# Patient Record
Sex: Female | Born: 2000 | Race: White | Hispanic: No | Marital: Single | Smoking: Former smoker
Health system: Southern US, Community
[De-identification: ages and names within clinical notes are randomized; demographics above are authoritative.]

## PROBLEM LIST (undated history)

## (undated) DIAGNOSIS — F32A Depression, unspecified: Secondary | ICD-10-CM

## (undated) DIAGNOSIS — R7989 Other specified abnormal findings of blood chemistry: Secondary | ICD-10-CM

## (undated) DIAGNOSIS — N838 Other noninflammatory disorders of ovary, fallopian tube and broad ligament: Secondary | ICD-10-CM

## (undated) DIAGNOSIS — F321 Major depressive disorder, single episode, moderate: Secondary | ICD-10-CM

## (undated) DIAGNOSIS — Z8619 Personal history of other infectious and parasitic diseases: Secondary | ICD-10-CM

## (undated) HISTORY — DX: Major depressive disorder, single episode, moderate: F32.1

## (undated) HISTORY — DX: Depression, unspecified: F32.A

## (undated) HISTORY — DX: Personal history of other infectious and parasitic diseases: Z86.19

## (undated) HISTORY — DX: Other noninflammatory disorders of ovary, fallopian tube and broad ligament: N83.8

## (undated) HISTORY — DX: Other specified abnormal findings of blood chemistry: R79.89

---
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
## 2019-01-10 ENCOUNTER — Encounter: Payer: Self-pay | Admitting: Obstetrics and Gynecology

## 2019-01-10 ENCOUNTER — Ambulatory Visit (INDEPENDENT_AMBULATORY_CARE_PROVIDER_SITE_OTHER): Payer: BLUE CROSS/BLUE SHIELD | Admitting: Obstetrics and Gynecology

## 2019-01-10 ENCOUNTER — Other Ambulatory Visit: Payer: Self-pay

## 2019-01-10 VITALS — BP 110/60 | HR 81 | Ht 66.0 in | Wt 154.0 lb

## 2019-01-10 DIAGNOSIS — R300 Dysuria: Secondary | ICD-10-CM

## 2019-01-10 DIAGNOSIS — N760 Acute vaginitis: Secondary | ICD-10-CM

## 2019-01-10 DIAGNOSIS — Z1329 Encounter for screening for other suspected endocrine disorder: Secondary | ICD-10-CM

## 2019-01-10 DIAGNOSIS — Z113 Encounter for screening for infections with a predominantly sexual mode of transmission: Secondary | ICD-10-CM

## 2019-01-10 DIAGNOSIS — Z0000 Encounter for general adult medical examination without abnormal findings: Secondary | ICD-10-CM | POA: Diagnosis not present

## 2019-01-10 DIAGNOSIS — Z30013 Encounter for initial prescription of injectable contraceptive: Secondary | ICD-10-CM

## 2019-01-10 MED ORDER — MEDROXYPROGESTERONE ACETATE 150 MG/ML IM SUSP: 150.0000 mg | INTRAMUSCULAR | 3 refills | Status: DC

## 2019-01-10 NOTE — Progress Notes (Signed)
Gynecology Annual Exam  PCP: Steele Sizer, MD  Chief Complaint:  Chief Complaint  Patient presents with  . Gynecologic Exam    Hasnt had Depo sense December 2019, and no period     History of Present Illness: Patient is a 18 y.o. G0P0000 presents for annual exam. The patient has no complaints today.   LMP: Patient's last menstrual period was 08/23/2018 (within days).  Average Interval: irregular, no period since January, last Depo injection was in January. UPT today in office was negative.  Duration of flow: 0 days Heavy Menses: no Clots: no Intermenstrual Bleeding: no Postcoital Bleeding: yes- for 1 week Dysmenorrhea: yes. Moderate painful cramps with periods usually  The patient is sexually active. She currently uses none for contraception. She has dyspareunia.  The patient does not perform self breast exams.  There is no notable family history of breast or ovarian cancer in her family.  She reports recent pelvic pain and pain with intercourse. She has also notice pain with urination.  The patient wears seatbelts: yes.  The patient has regular exercise: not asked.    The patient denies current symptoms of depression.    Review of Systems: Review of Systems  Constitutional: Negative for chills, fever, malaise/fatigue and weight loss.  HENT: Negative for congestion, hearing loss and sinus pain.   Eyes: Negative for blurred vision and double vision.  Respiratory: Negative for cough, sputum production, shortness of breath and wheezing.   Cardiovascular: Negative for chest pain, palpitations, orthopnea and leg swelling.  Gastrointestinal: Negative for abdominal pain, constipation, diarrhea, nausea and vomiting.  Genitourinary: Positive for dysuria. Negative for flank pain, frequency, hematuria and urgency.  Musculoskeletal: Negative for back pain, falls and joint pain.  Skin: Negative for itching and rash.  Neurological: Negative for dizziness and headaches.   Psychiatric/Behavioral: Negative for depression, substance abuse and suicidal ideas. The patient is not nervous/anxious.     Past Medical History:  Past Medical History:  Diagnosis Date  . Elevated TSH   . History of chicken pox   . Mass of left ovary   . Moderate depressive episode [facility])     Past Surgical History:  Past Surgical History:  Procedure Laterality Date  . DENTAL SURGERY  2019   Dental implant    Gynecologic History:  Patient's last menstrual period was 08/23/2018 (within days). Contraception: none Last Pap: Inder 21  Obstetric History: G0P0000  Family History:  Family History  Problem Relation Age of Onset  . Obesity Mother     Social History:  Social History   Socioeconomic History  . Marital status: Single    Spouse name: Not on file  . Number of children: Not on file  . Years of education: Not on file  . Highest education level: Not on file  Occupational History  . Not on file  Social Needs  . Financial resource strain: Not on file  . Food insecurity:    Worry: Not on file    Inability: Not on file  . Transportation needs:    Medical: Not on file    Non-medical: Not on file  Tobacco Use  . Smoking status: Never Smoker  . Smokeless tobacco: Never Used  Substance and Sexual Activity  . Alcohol use: No    Alcohol/week: 0.0 standard drinks  . Drug use: Yes    Frequency: 7.0 times per week    Types: Marijuana    Comment: smoking   . Sexual activity: Yes  Partners: Male    Birth control/protection: None  Lifestyle  . Physical activity:    Days per week: 3 days    Minutes per session: 30 min  . Stress: Not at all  Relationships  . Social connections:    Talks on phone: Not on file    Gets together: Not on file    Attends religious service: Not on file    Active member of club or organization: Not on file    Attends meetings of clubs or organizations: Not on file    Relationship status: Not on file  . Intimate partner violence:     Fear of current or ex partner: Not on file    Emotionally abused: Not on file    Physically abused: Not on file    Forced sexual activity: Not on file  Other Topics Concern  . Not on file  Social History Narrative  . Not on file    Allergies:  No Known Allergies  Medications: Prior to Admission medications   Medication Sig Start Date End Date Taking? Authorizing Provider  lidocaine (XYLOCAINE) 2 % solution Use as directed 15 mLs in the mouth or throat daily before supper. And qhs Patient not taking: Reported on 01/10/2019 02/27/18   Steele Sizer, MD  medroxyPROGESTERone (DEPO-PROVERA) 150 MG/ML injection Inject 1 mL (150 mg total) into the muscle every 3 (three) months. Patient not taking: Reported on 01/10/2019 11/28/17   Steele Sizer, MD    Physical Exam Vitals: Blood pressure 110/60, pulse 81, height 5\' 6"  (1.676 m), weight 154 lb (69.9 kg), last menstrual period 08/23/2018.  General: NAD HEENT: normocephalic, anicteric Thyroid: no enlargement, no palpable nodules Pulmonary: No increased work of breathing, CTAB Cardiovascular: RRR, distal pulses 2+ Abdomen: NABS, soft, non-tender, non-distended.  Umbilicus without lesions.  No hepatomegaly, splenomegaly or masses palpable. No evidence of hernia  Genitourinary: offered, but patient declined. Opted to collect her own vaginal swab. Extremities: no edema, erythema, or tenderness Neurologic: Grossly intact Psychiatric: mood appropriate, affect full  Assessment: 18 y.o. G0P0000 routine annual exam  Plan: Problem List Items Addressed This Visit    None    Visit Diagnoses    Well adult exam    -  Primary   Relevant Orders   Basic Metabolic Panel (BMET)   TSH   CBC   Screening examination for STD (sexually transmitted disease)       Relevant Orders   HEP, RPR, HIV Panel   HSV(herpes smplx)abs-1+2(IgG+IgM)-bld   Acute vaginitis       Relevant Orders   NuSwab Vaginitis Plus (VG+)   Thyroid disorder screen        Relevant Orders   TSH   Encounter for initial prescription of injectable contraceptive       Relevant Medications   medroxyPROGESTERone (DEPO-PROVERA) 150 MG/ML injection   Dysuria       Relevant Orders   Urine Culture      1)  Gardasil Series discussed and if applicable offered to patient - Patient has previously completed 3 shot series. - Advised patient about vaccination with 9 valent. She will consider.   2) STI screening  was offered and accepted.   3)  ASCCP guidelines and rational discussed.  Patient opts for initiating screening at 21 screening interval  4) Contraception - the patient is currently using  none.  She is interested in starting Contraception: She would like to resumr Depo Provera We discussed safe sex practices to reduce her furture risk of  STI's.  Reviewed the quick start algorithm with the patient. Her last unprotected intercourse was 5 days ago. Discussed repeating her urine pregnancy test in 2-4 weeks to ensure that she is  not pregnant. Patient stated understanding. She will return tomorrow for depo provera administration.   5) Return in about 1 day (around 01/11/2019) for Depo injection visit in 1 day, annual in 1 year.  Adrian Prows MD [facility], [facility] 01/10/2019 9:46 PM

## 2019-01-11 ENCOUNTER — Ambulatory Visit: Payer: BLUE CROSS/BLUE SHIELD

## 2019-01-12 ENCOUNTER — Other Ambulatory Visit: Payer: Self-pay | Admitting: Obstetrics and Gynecology

## 2019-01-12 LAB — CBC
Hematocrit: 40.6 % (ref 34.0–46.6)
Hemoglobin: 13.9 g/dL (ref 11.1–15.9)
MCH: 30.7 pg (ref 26.6–33.0)
MCHC: 34.2 g/dL (ref 31.5–35.7)
MCV: 90 fL (ref 79–97)
Platelets: 218 10*3/uL (ref 150–450)
RBC: 4.53 x10E6/uL (ref 3.77–5.28)
RDW: 11.8 % (ref 11.7–15.4)
WBC: 7 10*3/uL (ref 3.4–10.8)

## 2019-01-12 LAB — HSV(HERPES SMPLX)ABS-I+II(IGG+IGM)-BLD
HSV 1 Glycoprotein G Ab, IgG: <0.91 index (ref 0.00–0.90)
HSV 2 IgG, Type Spec: <0.91 index (ref 0.00–0.90)
HSVI/II Comb IgM: <0.91 Ratio (ref 0.00–0.90)

## 2019-01-12 LAB — HEP, RPR, HIV PANEL
HIV Screen 4th Generation wRfx: NONREACTIVE
Hepatitis B Surface Ag: NEGATIVE
RPR Ser Ql: REACTIVE — AB

## 2019-01-12 LAB — BASIC METABOLIC PANEL
BUN/Creatinine Ratio: 14 (ref 9–23)
BUN: 11 mg/dL (ref 6–20)
CO2: 23 mmol/L (ref 20–29)
Calcium: 9.7 mg/dL (ref 8.7–10.2)
Chloride: 101 mmol/L (ref 96–106)
Creatinine, Ser: 0.81 mg/dL (ref 0.57–1.00)
GFR calc Af Amer: 123 mL/min/{1.73_m2} (ref >59)
GFR calc non Af Amer: 106 mL/min/{1.73_m2} (ref >59)
Glucose: 84 mg/dL (ref 65–99)
Potassium: 3.9 mmol/L (ref 3.5–5.2)
Sodium: 139 mmol/L (ref 134–144)

## 2019-01-12 LAB — TSH: TSH: 3.12 u[IU]/mL (ref 0.450–4.500)

## 2019-01-12 LAB — RPR, QUANT. (REFLEX): Rapid Plasma Reagin, Quant: 1:1 {titer} — ABNORMAL HIGH

## 2019-01-13 LAB — URINE CULTURE

## 2019-01-15 ENCOUNTER — Other Ambulatory Visit: Payer: Self-pay | Admitting: Obstetrics and Gynecology

## 2019-01-15 ENCOUNTER — Telehealth: Payer: Self-pay | Admitting: Obstetrics and Gynecology

## 2019-01-15 NOTE — Telephone Encounter (Signed)
[employer] from [facility] calling to see if TPPA test done.  Adv it hasn't been ordered.  Per Cory's request, please order as [facility] only keeps specimens for 7d.

## 2019-01-15 NOTE — Telephone Encounter (Signed)
I discussed this with Sandy on 01/12/2019. She told me it was ordered.

## 2019-01-16 ENCOUNTER — Other Ambulatory Visit: Payer: Self-pay | Admitting: Obstetrics and Gynecology

## 2019-01-16 LAB — SPECIMEN STATUS REPORT

## 2019-01-16 LAB — T PALLIDUM SCREENING CASCADE: T pallidum Antibodies (TP-PA): NONREACTIVE

## 2019-01-20 LAB — NUSWAB VAGINITIS PLUS (VG+)
Candida albicans, NAA: NEGATIVE
Candida glabrata, NAA: NEGATIVE
Chlamydia trachomatis, NAA: NEGATIVE
Neisseria gonorrhoeae, NAA: NEGATIVE
Trich vag by NAA: NEGATIVE

## 2019-01-23 ENCOUNTER — Telehealth: Payer: Self-pay

## 2019-01-23 NOTE — Telephone Encounter (Signed)
Teresa [name] from [place] [facility] calling to see if T Pallidum results were in.  Adv they were non-reactive.

## 2019-01-24 NOTE — Progress Notes (Signed)
Attempted to contact 01/23/2018, did not answer mobile, left message asking her to return the call. No answer at home number.

## 2019-01-25 ENCOUNTER — Telehealth: Payer: Self-pay

## 2019-01-25 ENCOUNTER — Other Ambulatory Visit: Payer: Self-pay | Admitting: Obstetrics and Gynecology

## 2019-01-25 DIAGNOSIS — Z113 Encounter for screening for infections with a predominantly sexual mode of transmission: Secondary | ICD-10-CM

## 2019-01-25 NOTE — Telephone Encounter (Signed)
Please advise. Thank you

## 2019-01-25 NOTE — Telephone Encounter (Signed)
Pt called after hour nurse at 5:07 on 6/17 stating she just got a vm from the Dr's office.  [phone]

## 2019-01-25 NOTE — Progress Notes (Signed)
Called and discussed with patient. She will repeat RPR screen in 4 weeks

## 2019-01-25 NOTE — Telephone Encounter (Signed)
Returned call

## 2019-01-25 NOTE — Telephone Encounter (Signed)
Patient is calling to speak with Dr. Gilman Schmidt. Please advise

## 2019-02-15 ENCOUNTER — Other Ambulatory Visit: Payer: BLUE CROSS/BLUE SHIELD

## 2019-02-16 ENCOUNTER — Other Ambulatory Visit: Payer: BLUE CROSS/BLUE SHIELD

## 2019-02-22 ENCOUNTER — Other Ambulatory Visit: Payer: BLUE CROSS/BLUE SHIELD

## 2019-05-15 ENCOUNTER — Other Ambulatory Visit: Payer: Self-pay

## 2019-05-15 ENCOUNTER — Ambulatory Visit
Admission: RE | Admit: 2019-05-15 | Discharge: 2019-05-15 | Disposition: A | Discharge status: Home / Self Care | Payer: Medicaid Other | Attending: Family Medicine | Admitting: Family Medicine

## 2019-05-15 ENCOUNTER — Ambulatory Visit (INDEPENDENT_AMBULATORY_CARE_PROVIDER_SITE_OTHER): Payer: Medicaid Other | Admitting: Family Medicine

## 2019-05-15 ENCOUNTER — Ambulatory Visit
Admission: RE | Admit: 2019-05-15 | Discharge: 2019-05-15 | Disposition: A | Discharge status: Home / Self Care | Payer: Medicaid Other | Source: Ambulatory Visit | Attending: Family Medicine | Admitting: Family Medicine

## 2019-05-15 ENCOUNTER — Encounter: Payer: Self-pay | Admitting: Family Medicine

## 2019-05-15 VITALS — BP 96/62 | HR 111 | Temp 97.8°F | Resp 14 | Ht 65.0 in | Wt 145.0 lb

## 2019-05-15 DIAGNOSIS — M25572 Pain in left ankle and joints of left foot: Secondary | ICD-10-CM | POA: Diagnosis not present

## 2019-05-15 DIAGNOSIS — S99912A Unspecified injury of left ankle, initial encounter: Secondary | ICD-10-CM | POA: Diagnosis not present

## 2019-05-15 NOTE — Patient Instructions (Addendum)
800 mg ibuprofen up to three times a day, and tylenol as directed for pain Also rest, Ice elevate, avoid weight bearing I'll call you after I see xrays and let you know what to do next (brace vs ortho etc)  . RICE Therapy for Routine Care of Injuries Many injuries can be cared for with rest, ice, compression, and elevation (RICE therapy). This includes:  Resting the injured part.  Putting ice on the injury.  Putting pressure (compression) on the injury.  Raising the injured part (elevation). Using RICE therapy can help to lessen pain and swelling. Supplies needed:  Ice.  Plastic bag.  Towel.  Elastic bandage.  Pillow or pillows to raise (elevate) your injured body part. How to care for your injury with RICE therapy Rest Limit your normal activities, and try not to use the injured part of your body. You can go back to your normal activities when your doctor says it is okay to do them and you feel okay. Ask your doctor if you should do exercises to help your injury get better. Ice Put ice on the injured area. Do not put ice on your bare skin.  Put ice in a plastic bag.  Place a towel between your skin and the bag.  Leave the ice on for 20 minutes, 2-3 times a day. Use ice on as many days as told by your doctor.  Compression Compression means putting pressure on the injured area. This can be done with an elastic bandage. If an elastic bandage has been put on your injury:  Do not wrap the bandage too tight. Wrap the bandage more loosely if part of your body away from the bandage is blue, swollen, cold, painful, or loses feeling (gets numb).  Take off the bandage and put it on again. Do this every 3-4 hours or as told by your doctor.  See your doctor if the bandage seems to make your problems worse.  Elevation Elevation means keeping the injured area raised. If you can, raise the injured area above your heart or the center of your chest. Contact a doctor if:  You keep  having pain and swelling.  Your symptoms get worse. Get help right away if:  You have sudden bad pain at your injury or lower than your injury.  You have redness or more swelling around your injury.  You have tingling or numbness at your injury or lower than your injury, and it does not go away when you take off the bandage. Summary  Many injuries can be cared for using rest, ice, compression, and elevation (RICE therapy).  You can go back to your normal activities when you feel okay and your doctor says it is okay.  Put ice on the injured area as told by your doctor.  Get help if your symptoms get worse or if you keep having pain and swelling. This information is not intended to replace advice given to you by your health care provider. Make sure you discuss any questions you have with your health care provider. Document Released: 01/12/2008 Document Revised: 04/15/2017 Document Reviewed: 04/15/2017 Elsevier Patient Education  2020 [employer].

## 2019-05-15 NOTE — Progress Notes (Signed)
Patient ID: Teresa [name], female    DOB: [birth year], 18 y.o.   MRN: [id number]  PCP: Steele Sizer, MD  Chief Complaint  Patient presents with  . Ankle Pain    swollen ankle, fell this morning coming down the stairs.    Subjective:   Teresa [name] is a 18 y.o. female, presents to clinic with CC of the following:  HPI  Patient complains of lateral left ankle pain and swelling that occurred this morning when she "felt on the stairs".  She tells me that she slipped on the bottom 2 stairs and inverted and rolled her ankle.  She previously told the MA student that she was drunk and fell down the stairs.  To me she denies any head injury, any other areas of pain and she denies any loss of consciousness.   She states that it took her a minute to get up and she was able to bear weight, she is limping slightly.  The lateral malleolus is swollen and tender to the touch there is no bruising.  She works all day on her feet at a [employer], she request a work note to allow her to rest her foot.  She denies any numbness, tingling. Pain has been moderate since injury, no meds tried, no other treatments, pain is exacerbated with walking or palpation and no other alleviating factors.  She is wearing slides and walked into office independently w/o assistance  Patient Active Problem List   Diagnosis Date Noted  . History of suicide attempt 06/01/2016  . MDD (major depressive disorder), recurrent episode, moderate ([place]) 05/31/2016  . History of ankle sprain 09/23/2015  . AD (atopic dermatitis) 05/12/2015  . Family planning 05/12/2015  . Elevated TSH 05/12/2015  . History of chlamydia infection 05/12/2015  . Screening for STD (sexually transmitted disease) 05/12/2015      Current Outpatient Medications:  .  lidocaine (XYLOCAINE) 2 % solution, Use as directed 15 mLs in the mouth or throat daily before supper. And qhs (Patient not taking: Reported on 01/10/2019), Disp: 200 mL, Rfl: 0 .   medroxyPROGESTERone (DEPO-PROVERA) 150 MG/ML injection, Inject 1 mL (150 mg total) into the muscle every 3 (three) months. (Patient not taking: Reported on 05/15/2019), Disp: 1 mL, Rfl: 3   No Known Allergies   Family History  Problem Relation Age of Onset  . Obesity Mother      Social History   Socioeconomic History  . Marital status: Single    Spouse name: Not on file  . Number of children: Not on file  . Years of education: Not on file  . Highest education level: Not on file  Occupational History  . Not on file  Social Needs  . Financial resource strain: Not on file  . Food insecurity    Worry: Not on file    Inability: Not on file  . Transportation needs    Medical: Not on file    Non-medical: Not on file  Tobacco Use  . Smoking status: Never Smoker  . Smokeless tobacco: Never Used  Substance and Sexual Activity  . Alcohol use: No    Alcohol/week: 0.0 standard drinks  . Drug use: Yes    Frequency: 7.0 times per week    Types: Marijuana    Comment: smoking   . Sexual activity: Yes    Partners: Male    Birth control/protection: None  Lifestyle  . Physical activity    Days per week: 3 days  Minutes per session: 30 min  . Stress: Not at all  Relationships  . Social Herbalist on phone: Not on file    Gets together: Not on file    Attends religious service: Not on file    Active member of club or organization: Not on file    Attends meetings of clubs or organizations: Not on file    Relationship status: Not on file  . Intimate partner violence    Fear of current or ex partner: Not on file    Emotionally abused: Not on file    Physically abused: Not on file    Forced sexual activity: Not on file  Other Topics Concern  . Not on file  Social History Narrative  . Not on file    Review of Systems  Constitutional: Negative.   HENT: Negative.   Eyes: Negative.   Respiratory: Negative.   Cardiovascular: Negative.   Gastrointestinal: Negative.    Endocrine: Negative.   Genitourinary: Negative.   Musculoskeletal: Negative.   Skin: Negative.   Allergic/Immunologic: Negative.   Neurological: Negative.   Hematological: Negative.   Psychiatric/Behavioral: Negative.   All other systems reviewed and are negative.      Objective:   Vitals:   05/15/19 1353  BP: 96/62  Pulse: (!) 111  Resp: 14  Temp: 97.8 F (36.6 C)  SpO2: 98%  Weight: 145 lb (65.8 kg)  Height: 5\' 5"  (1.651 m)    Body mass index is 24.13 kg/m.  Physical Exam Vitals signs and nursing note reviewed.  Constitutional:      Appearance: She is well-developed.  HENT:     Head: Normocephalic and atraumatic.     Nose: Nose normal.  Eyes:     General:        Right eye: No discharge.        Left eye: No discharge.     Conjunctiva/sclera: Conjunctivae normal.  Neck:     Trachea: No tracheal deviation.  Cardiovascular:     Rate and Rhythm: Normal rate and regular rhythm.  Pulmonary:     Effort: Pulmonary effort is normal. No respiratory distress.     Breath sounds: No stridor.  Musculoskeletal: Normal range of motion.     Left ankle: She exhibits swelling. She exhibits normal range of motion, no ecchymosis, no deformity, no laceration and normal pulse. Tenderness. Lateral malleolus tenderness found. No medial malleolus tenderness found. Achilles tendon normal.  Skin:    General: Skin is warm and dry.     Findings: No rash.  Neurological:     Mental Status: She is alert.     Motor: No abnormal muscle tone.     Coordination: Coordination normal.  Psychiatric:        Behavior: Behavior normal.            Assessment & Plan:      ICD-10-CM   1. Acute left ankle pain  M25.572 DG Ankle Complete Left    Swollen left ankle after injury early this morning.  Patient was able to walk on it independently immediately after the accident, gait is fairly stable here today.  There is some evident lateral malleolus edema and she is diffusely tender but she  has good range of motion sensation and strength.  Will get x-ray due to bony tenderness, fibular fracture versus ankle sprain.  Work note given to excuse her from today and tomorrow until I get x-ray imaging, advised rice therapy.  Explained to  the patient that until x-ray results will not know with a follow-up plan or return to work plan and she may need to return to be cleared or go to specialist to be cleared later.    Delsa Grana, PA-C 05/15/19 2:27 PM

## 2019-06-05 ENCOUNTER — Ambulatory Visit: Payer: Self-pay

## 2019-06-08 ENCOUNTER — Ambulatory Visit: Payer: Self-pay

## 2019-06-19 ENCOUNTER — Ambulatory Visit: Payer: Medicaid Other | Admitting: Family Medicine

## 2019-06-28 ENCOUNTER — Emergency Department
Admission: EM | Admit: 2019-06-28 | Discharge: 2019-06-28 | Disposition: A | Discharge status: Home / Self Care | Payer: Medicaid Other | Attending: Emergency Medicine | Admitting: Emergency Medicine

## 2019-06-28 ENCOUNTER — Encounter: Payer: Self-pay | Admitting: Emergency Medicine

## 2019-06-28 ENCOUNTER — Other Ambulatory Visit: Payer: Self-pay

## 2019-06-28 ENCOUNTER — Emergency Department: Payer: Medicaid Other

## 2019-06-28 DIAGNOSIS — N760 Acute vaginitis: Secondary | ICD-10-CM | POA: Insufficient documentation

## 2019-06-28 DIAGNOSIS — R102 Pelvic and perineal pain: Secondary | ICD-10-CM | POA: Diagnosis not present

## 2019-06-28 DIAGNOSIS — Z79899 Other long term (current) drug therapy: Secondary | ICD-10-CM | POA: Diagnosis not present

## 2019-06-28 DIAGNOSIS — N939 Abnormal uterine and vaginal bleeding, unspecified: Secondary | ICD-10-CM | POA: Diagnosis not present

## 2019-06-28 DIAGNOSIS — B9689 Other specified bacterial agents as the cause of diseases classified elsewhere: Secondary | ICD-10-CM | POA: Diagnosis not present

## 2019-06-28 DIAGNOSIS — R252 Cramp and spasm: Secondary | ICD-10-CM | POA: Diagnosis present

## 2019-06-28 LAB — URINALYSIS, COMPLETE (UACMP) WITH MICROSCOPIC
Bacteria, UA: NONE SEEN
Bilirubin Urine: NEGATIVE
Glucose, UA: NEGATIVE mg/dL
Hgb urine dipstick: NEGATIVE
Ketones, ur: NEGATIVE mg/dL
Leukocytes,Ua: NEGATIVE
Nitrite: NEGATIVE
Protein, ur: NEGATIVE mg/dL
Specific Gravity, Urine: 1.017 (ref 1.005–1.030)
pH: 6 (ref 5.0–8.0)

## 2019-06-28 LAB — WET PREP, GENITAL
Sperm: NONE SEEN
Trich, Wet Prep: NONE SEEN
Yeast Wet Prep HPF POC: NONE SEEN

## 2019-06-28 LAB — POCT PREGNANCY, URINE: Preg Test, Ur: NEGATIVE

## 2019-06-28 MED ORDER — FLUCONAZOLE 150 MG PO TABS: 150.0000 mg | ORAL_TABLET | Freq: Once | ORAL | 0 refills | Status: AC

## 2019-06-28 MED ORDER — MIDOL MAX ST MENSTRUAL 500-60-15 MG PO TABS: 1.0000 | ORAL_TABLET | ORAL | 0 refills | Status: DC | PRN

## 2019-06-28 MED ORDER — METRONIDAZOLE 500 MG PO TABS: 500.0000 mg | ORAL_TABLET | Freq: Two times a day (BID) | ORAL | 0 refills | Status: DC

## 2019-06-28 NOTE — ED Notes (Signed)
See triage note   Presents with abd cramping  States she stopped depo about 6 months ago  Had first period about 3 month ago  States her period has been irreg  But she has been having some bleeding for the past 2 weeks

## 2019-06-28 NOTE — ED Triage Notes (Signed)
Pt presents to ED via POV with c/o intermittent menstrual cramps and intermittent spotting x 2 weeks. Pt states recently changed BC, states sometimes mesntrual cycle is spotting bright red, sometimes is dark brown. Denies abnormally heavy bleeding at this time, states "I feel like for the cramps I'm having it should be a lot heavier than it is".

## 2019-06-28 NOTE — ED Provider Notes (Signed)
[facility] Emergency Department Provider Note  ____________________________________________  Time seen: Approximately 7:47 PM  I have reviewed the triage vital signs and the nursing notes.   HISTORY  Chief Complaint Menstrual Cramps    HPI Teresa [name] is a 18 y.o. female who presents the emergency department complaining of ongoing vaginal bleeding, right pelvic pain x2 weeks.  Patient reports she has had intermittent light vaginal bleeding x2 weeks with associated intermittent right pelvic pain.  Patient states that she was on Depo shots for over a year, then stopped earlier this year.  She has had irregular periods since then.  She reports it has been roughly 2 to 3 months since her last menstrual cycle.  Patient denies any fevers or chills, no dysuria, polyuria, hematuria.  Patient denies any vaginal discharge.  Patient states that she is concerned there may be something "all of my ovary" given her pain.  No history of cysts.  Patient states that she is actively trying to become pregnant.           Past Medical History:  Diagnosis Date  . Elevated TSH   . History of chicken pox   . Mass of left ovary   . Moderate depressive episode [facility])     Patient Active Problem List   Diagnosis Date Noted  . History of suicide attempt 06/01/2016  . MDD (major depressive disorder), recurrent episode, moderate ([place]) 05/31/2016  . History of ankle sprain 09/23/2015  . AD (atopic dermatitis) 05/12/2015  . Family planning 05/12/2015  . Elevated TSH 05/12/2015  . History of chlamydia infection 05/12/2015  . Screening for STD (sexually transmitted disease) 05/12/2015    Past Surgical History:  Procedure Laterality Date  . DENTAL SURGERY  2019   Dental implant    Prior to Admission medications   Medication Sig Start Date End Date Taking? Authorizing Provider  Acetaminophen-Caff-Pyrilamine (MIDOL MAX ST MENSTRUAL) 500-60-15 MG TABS Take 1 tablet by mouth every  4 (four) hours as needed. 06/28/19   Swara Donze, Charline Bills, PA-C  fluconazole (DIFLUCAN) 150 MG tablet Take 1 tablet (150 mg total) by mouth once for 1 dose. Take after finishing antibiotic 06/28/19 06/28/19  Sussie Minor, Charline Bills, PA-C  metroNIDAZOLE (FLAGYL) 500 MG tablet Take 1 tablet (500 mg total) by mouth 2 (two) times daily. 06/28/19   Ramey Schiff, Charline Bills, PA-C    Allergies Patient has no known allergies.  Family History  Problem Relation Age of Onset  . Obesity Mother     Social History Social History   Tobacco Use  . Smoking status: Never Smoker  . Smokeless tobacco: Never Used  Substance Use Topics  . Alcohol use: No    Alcohol/week: 0.0 standard drinks  . Drug use: Yes    Frequency: 7.0 times per week    Types: Marijuana    Comment: smoking      Review of Systems  Constitutional: No fever/chills Eyes: No visual changes. No discharge ENT: No upper respiratory complaints. Cardiovascular: no chest pain. Respiratory: no cough. No SOB. Gastrointestinal: No abdominal pain.  No nausea, no vomiting.  No diarrhea.  No constipation. Genitourinary: Negative for dysuria. No hematuria. Vaginal bleeding, No vaginal discharge Musculoskeletal: Negative for musculoskeletal pain. Skin: Negative for rash, abrasions, lacerations, ecchymosis. Neurological: Negative for headaches, focal weakness or numbness. 10-point ROS otherwise negative.  ____________________________________________   PHYSICAL EXAM:  VITAL SIGNS: ED Triage Vitals  Enc Vitals Group     BP 06/28/19 1753 111/61  Pulse Rate 06/28/19 1753 97     Resp 06/28/19 1753 18     Temp 06/28/19 1753 98.4 F (36.9 C)     Temp Source 06/28/19 1753 Oral     SpO2 06/28/19 1753 100 %     Weight 06/28/19 1735 120 lb (54.4 kg)     Height 06/28/19 1735 5\' 6"  (1.676 m)     Head Circumference --      Peak Flow --      Pain Score 06/28/19 1735 5     Pain Loc --      Pain Edu? --      Excl. in [place]? --       Constitutional: Alert and oriented. Well appearing and in no acute distress. Eyes: Conjunctivae are normal. PERRL. EOMI. Head: Atraumatic. ENT:      Ears:       Nose: No congestion/rhinnorhea.      Mouth/Throat: Mucous membranes are moist.  Neck: No stridor.    Cardiovascular: Normal rate, regular rhythm. Normal S1 and S2.  Good peripheral circulation. Respiratory: Normal respiratory effort without tachypnea or retractions. Lungs CTAB. Good air entry to the bases with no decreased or absent breath sounds. Gastrointestinal: Bowel sounds 4 quadrants. Soft to palpation all quadrants.  Patient is mildly tender to palpation in the right lower pelvic region.  No frank right lower quadrant pain/tenderness to palpation.. No guarding or rigidity. No palpable masses. No distention. No CVA tenderness. Genitourinary: No external lesions or chancres noted.  Exam of speculum reveals no vaginal lesions or tears.  Cervix is identified.  Patient is currently menstruating and the blood is noted in the vaginal vault.  No other appreciable discharge identified.  No cervical motion tenderness.  No palpable lesions or masses in the bilateral adnexa. Musculoskeletal: Full range of motion to all extremities. No gross deformities appreciated. Neurologic:  Normal speech and language. No gross focal neurologic deficits are appreciated.  Skin:  Skin is warm, dry and intact. No rash noted. Psychiatric: Mood and affect are normal. Speech and behavior are normal. Patient exhibits appropriate insight and judgement.   ____________________________________________   LABS (all labs ordered are listed, but only abnormal results are displayed)  Labs Reviewed  WET PREP, GENITAL - Abnormal; Notable for the following components:      Result Value   Clue Cells Wet Prep HPF POC PRESENT (*)    WBC, Wet Prep HPF POC FEW (*)    All other components within normal limits  URINALYSIS, COMPLETE (UACMP) WITH MICROSCOPIC -  Abnormal; Notable for the following components:   Color, Urine YELLOW (*)    APPearance CLEAR (*)    All other components within normal limits  GC/CHLAMYDIA PROBE AMP  POC URINE PREG, ED  POCT PREGNANCY, URINE   ____________________________________________  EKG   ____________________________________________  RADIOLOGY I personally viewed and evaluated these images as part of my medical decision making, as well as reviewing the written report by the radiologist.  US Pelvic Complete W Transvaginal And Torsion R/o  Result Date: 06/28/2019 CLINICAL DATA:  Pelvic pain, vaginal bleeding EXAM: TRANSABDOMINAL AND TRANSVAGINAL ULTRASOUND OF PELVIS DOPPLER ULTRASOUND OF OVARIES TECHNIQUE: Both transabdominal and transvaginal ultrasound examinations of the pelvis were performed. Transabdominal technique was performed for global imaging of the pelvis including uterus, ovaries, adnexal regions, and pelvic cul-de-sac. It was necessary to proceed with endovaginal exam following the transabdominal exam to visualize the uterus, endometrium, ovaries and adnexa. Color and duplex Doppler ultrasound was utilized to evaluate  blood flow to the ovaries. COMPARISON:  09/26/2014 FINDINGS: Uterus Measurements: 7.9 x 3.4 x 4.8 cm = volume: 65 mL. No fibroids or other mass visualized. Endometrium Thickness: 5 mm in thickness.  No focal abnormality visualized. Right ovary Measurements: 3.3 x 1.3 x 1.5 cm = volume: 3.0 mL. Normal appearance/no adnexal mass. Left ovary Measurements: 3.2 x 1.7 x 2.1 cm = volume: 6.0 mL. Normal appearance/no adnexal mass. Pulsed Doppler evaluation of both ovaries demonstrates normal low-resistance arterial and venous waveforms. Other findings No abnormal free fluid. IMPRESSION: Normal study. Electronically Signed   By: Rolm Baptise M.D.   On: 06/28/2019 19:34    ____________________________________________    PROCEDURES  Procedure(s) performed:    Procedures    Medications - No  data to display   ____________________________________________   INITIAL IMPRESSION / ASSESSMENT AND PLAN / ED COURSE  Pertinent labs & imaging results that were available during my care of the patient were reviewed by me and considered in my medical decision making (see chart for details).  Review of the [place] CSRS was performed in accordance of the [facility] prior to dispensing any controlled drugs.           Patient's diagnosis is consistent with ongoing vaginal bleeding, pelvic pain, BV.  Patient presented to emergency department with a complaint of intermittent right pelvic pain, ongoing vaginal bleeding x2 weeks.  Patient states that she has been on Depo until recently, has not had a return to her normal menstrual cycle after stopping this form of birth control.  She is not on birth control currently as she states that she is trying to become pregnant.  Patient has had what she describes as a 2-week long menstrual cycle.  She states that the cramping is not worse than initial psychologist a lot longer.  Patient denied any other vaginal discharge, dysuria, abdominal pain, nausea vomiting, diarrhea or constipation.  Patient did not try any medications at home.  After initial evaluation for pelvic pain and ongoing vaginal bleeding, patient expressed a concern for STD.  Patient does not endorse any specific known contact with an STD but states that she would like to be checked.  This is a send off test and will not return at this time.  Labs returned positive with clue cells.  Patient will be started on Flagyl for BV.  I will prescribe fluconazole to be taken after finishing antibiotic course if patient begins to develop symptoms of candidal infection.  Patient is also given Midol for ongoing pelvic cramps from initial cycle.  Follow-up with primary care or OB/GYN as needed.  Patient is given ED precautions to return to the ED for any worsening or new  symptoms.     ____________________________________________  FINAL CLINICAL IMPRESSION(S) / ED DIAGNOSES  Final diagnoses:  Vaginal bleeding  Pelvic pain  BV (bacterial vaginosis)      NEW MEDICATIONS STARTED DURING THIS VISIT:  ED Discharge Orders         Ordered    metroNIDAZOLE (FLAGYL) 500 MG tablet  2 times daily     06/28/19 2048    fluconazole (DIFLUCAN) 150 MG tablet   Once     06/28/19 2048    Acetaminophen-Caff-Pyrilamine (MIDOL MAX ST MENSTRUAL) 500-60-15 MG TABS  Every 4 hours PRN     06/28/19 2048              This chart was dictated using voice recognition software/Dragon. Despite best efforts to proofread, errors can occur which can  change the meaning. Any change was purely unintentional.    Darletta Moll, PA-C 06/28/19 2049    Arta Silence, MD 06/28/19 2214

## 2019-07-20 ENCOUNTER — Other Ambulatory Visit: Payer: Self-pay

## 2019-07-20 DIAGNOSIS — Z20822 Contact with and (suspected) exposure to covid-19: Secondary | ICD-10-CM

## 2019-07-21 LAB — NOVEL CORONAVIRUS, NAA: SARS-CoV-2, NAA: NOT DETECTED

## 2019-09-05 ENCOUNTER — Ambulatory Visit: Payer: Medicaid Other | Attending: Internal Medicine

## 2019-09-05 DIAGNOSIS — Z20822 Contact with and (suspected) exposure to covid-19: Secondary | ICD-10-CM

## 2019-09-06 LAB — NOVEL CORONAVIRUS, NAA: SARS-CoV-2, NAA: NOT DETECTED

## 2019-09-10 ENCOUNTER — Ambulatory Visit: Payer: Medicaid Other | Admitting: Physician Assistant

## 2019-09-10 ENCOUNTER — Encounter: Payer: Self-pay | Admitting: Physician Assistant

## 2019-09-10 ENCOUNTER — Other Ambulatory Visit: Payer: Self-pay

## 2019-09-10 DIAGNOSIS — Z113 Encounter for screening for infections with a predominantly sexual mode of transmission: Secondary | ICD-10-CM | POA: Diagnosis not present

## 2019-09-10 DIAGNOSIS — Z0389 Encounter for observation for other suspected diseases and conditions ruled out: Secondary | ICD-10-CM | POA: Diagnosis not present

## 2019-09-10 DIAGNOSIS — Z3161 Procreative counseling and advice using natural family planning: Secondary | ICD-10-CM

## 2019-09-10 DIAGNOSIS — Z3009 Encounter for other general counseling and advice on contraception: Secondary | ICD-10-CM | POA: Diagnosis not present

## 2019-09-10 DIAGNOSIS — Z1388 Encounter for screening for disorder due to exposure to contaminants: Secondary | ICD-10-CM | POA: Diagnosis not present

## 2019-09-10 LAB — WET PREP FOR TRICH, YEAST, CLUE
Trichomonas Exam: NEGATIVE
Yeast Exam: NEGATIVE

## 2019-09-10 MED ORDER — MULTIVITAMINS PO CAPS: 1.0000 | ORAL_CAPSULE | Freq: Every day | ORAL | 0 refills | Status: DC

## 2019-09-10 NOTE — Progress Notes (Signed)
Wet mount reviewed by provider, no tx per Antoine Primas, PA.

## 2019-09-10 NOTE — Progress Notes (Signed)
[facility] Department STI clinic/screening visit  Subjective:  Teresa [name] is a 19 y.o. female being seen today for an STI screening visit. The patient reports they do not have symptoms.  Patient reports that they are thinking about becoming pregnant in the next year.   They reported they are not interested in discussing contraception today.  No LMP recorded. (Menstrual status: Irregular Periods).   Patient has the following medical conditions:   Patient Active Problem List   Diagnosis Date Noted  . History of suicide attempt 06/01/2016  . MDD (major depressive disorder), recurrent episode, moderate ([place]) 05/31/2016  . History of ankle sprain 09/23/2015  . AD (atopic dermatitis) 05/12/2015  . Family planning 05/12/2015  . Elevated TSH 05/12/2015  . History of chlamydia infection 05/12/2015  . Screening for STD (sexually transmitted disease) 05/12/2015    Chief Complaint  Patient presents with  . SEXUALLY TRANSMITTED DISEASE    STD screening including bloodwork    HPI  Patient reports that she is having some cramp-like sharp pains off and in but is not sure if it is related to her period or sexual activity.  Would like a screening to make sure no infection present.  Interested in info about [employer].  Denies other concerns today.  See flowsheet for further details and programmatic requirements.    The following portions of the patient's history were reviewed and updated as appropriate: allergies, current medications, past medical history, past social history, past surgical history and problem list.  Objective:  There were no vitals filed for this visit.  Physical Exam Constitutional:      General: She is not in acute distress.    Appearance: Normal appearance. She is normal weight.  HENT:     Head: Normocephalic and atraumatic.     Comments: No nits, lice or hair loss. No cervical, supraclavicular or axillary adenopathy.    Mouth/Throat:    Mouth: Mucous membranes are moist.     Pharynx: Oropharynx is clear. No oropharyngeal exudate.  Eyes:     Conjunctiva/sclera: Conjunctivae normal.  Pulmonary:     Effort: Pulmonary effort is normal.  Abdominal:     Palpations: Abdomen is soft. There is no mass.     Tenderness: There is no abdominal tenderness. There is no guarding or rebound.  Genitourinary:    General: Normal vulva.     Rectum: Normal.     Comments: External genitalia/pubic area without nits, lice, edema, erythema, lesions and inguinal adenopathy. Vagina with normal mucosa and discharge. Cervix without visible lesions. Uterus firm, mobile, nt, no masses, no CMT, no adnexal tenderness or fullness. Musculoskeletal:     Cervical back: Neck supple. No tenderness.  Skin:    General: Skin is warm and dry.     Findings: No bruising, erythema, lesion or rash.  Neurological:     Mental Status: She is alert and oriented to person, place, and time.  Psychiatric:        Mood and Affect: Mood normal.        Behavior: Behavior normal.        Thought Content: Thought content normal.        Judgment: Judgment normal.      Assessment and Plan:  Teresa [name] is a 19 y.o. female presenting to the [facility] Department for STI screening  1. Screening for STD (sexually transmitted disease) Patient into clinic with cramping.  Denies other symptoms. Rec condoms with all sex. Await test  results.  Counseled that RN will call if needs to RTC for treatment once results are back. - WET PREP FOR TRICH, YEAST, CLUE - Gonococcus culture - Chlamydia/Gonorrhea [place] [facility] - HIV [place] [facility] - Syphilis Serology, [place] [facility] - Gonococcus culture  2. Procreative counseling and advice using natural family planning Reviewed with patient Natural Family Planning info and written info also given to patient to review and chart cycles. - Multiple Vitamin (MULTIVITAMIN) capsule; Take 1 capsule by mouth daily.  Dispense: 100  capsule; Refill: 0     No follow-ups on file.  No future appointments.  Jerene Dilling, PA

## 2019-09-15 LAB — GONOCOCCUS CULTURE

## 2019-10-02 DIAGNOSIS — H5213 Myopia, bilateral: Secondary | ICD-10-CM | POA: Diagnosis not present

## 2019-10-03 DIAGNOSIS — H5213 Myopia, bilateral: Secondary | ICD-10-CM | POA: Diagnosis not present

## 2019-10-29 DIAGNOSIS — H5213 Myopia, bilateral: Secondary | ICD-10-CM | POA: Diagnosis not present

## 2019-12-11 ENCOUNTER — Ambulatory Visit: Payer: Self-pay

## 2019-12-11 DIAGNOSIS — U071 COVID-19: Secondary | ICD-10-CM | POA: Diagnosis not present

## 2019-12-11 NOTE — Telephone Encounter (Signed)
Patient called stating that she has had a rapid COVID-19 test that was negative today.  She is feeling so bad that she is tearful.  She states that she has sore throat cough stuffy nose and chest feels tight at times.  She states that she sometimes has a wheeze but she can control it with relaxing.  She has no fever.  She was tested for COVID-19 because her mother is positive.  She states they live in same home but Mom has been quarantining. Care advice read to patient.  She verbalized understanding. No appointment available today at office. Patient will go to [facility] for evaluation of symptoms. She was concerned about retesting. She was told to wait 5 days and if still having symptoms that she can retest. She verbalized understanding of all information.  Reason for Disposition . Wheezing is present  Answer Assessment - Initial Assessment Questions 1. ONSET: "When did the cough begin?"      2 days ago 2. SEVERITY: "How bad is the cough today?"     severe 3. RESPIRATORY DISTRESS: "Describe your breathing."      Some wheezing 4. FEVER: "Do you have a fever?" If so, ask: "What is your temperature, how was it measured, and when did it start?"     none 5. HEMOPTYSIS: "Are you coughing up any blood?" If so ask: "How much?" (flecks, streaks, tablespoons, etc.)     Non productive 6. TREATMENT: "What have you done so far to treat the cough?" (e.g., meds, fluids, humidifier)     Cough drops helps sore throat 7. CARDIAC HISTORY: "Do you have any history of heart disease?" (e.g., heart attack, congestive heart failure)      none 8. LUNG HISTORY: "Do you have any history of lung disease?"  (e.g., pulmonary embolus, asthma, emphysema)     none 9. PE RISK FACTORS: "Do you have a history of blood clots?" (or: recent major surgery, recent prolonged travel, bedridden)     no 10. OTHER SYMPTOMS: "Do you have any other symptoms? (e.g., runny nose, wheezing, chest pain)       Stuffy nose , chest tightness, sore  throat 11. PREGNANCY: "Is there any chance you are pregnant?" "When was your last menstrual period?"       No  Period 4/19 12. TRAVEL: "Have you traveled out of the country in the last month?" (e.g., travel history, exposures)       No exposed to mother who has covid 19 +  Protocols used: COUGH - ACUTE NON-PRODUCTIVE-A-AH

## 2019-12-12 ENCOUNTER — Telehealth: Payer: Self-pay

## 2019-12-12 NOTE — Telephone Encounter (Signed)
Copied from [place] [id number]. Topic: General - Other >> Dec 12, 2019 12:44 PM Mcneil, Ja-Kwan wrote: Reason for CRM: Pt returned call and stated she does not need an appt with Dr. Ancil Boozer.

## 2019-12-12 NOTE — Telephone Encounter (Signed)
Called patient. No answer LVM to see if she went to UC or if she needs an appointment on Thursday.

## 2019-12-20 ENCOUNTER — Ambulatory Visit: Payer: Medicaid Other

## 2019-12-21 ENCOUNTER — Ambulatory Visit: Payer: Medicaid Other

## 2019-12-24 ENCOUNTER — Ambulatory Visit: Payer: Medicaid Other

## 2019-12-24 DIAGNOSIS — R070 Pain in throat: Secondary | ICD-10-CM | POA: Diagnosis not present

## 2019-12-24 DIAGNOSIS — J209 Acute bronchitis, unspecified: Secondary | ICD-10-CM | POA: Diagnosis not present

## 2020-02-11 NOTE — Progress Notes (Signed)
Patient ID: Teresa [name], female    DOB: 2001/01/07, 19 y.o.   MRN: [id number]  PCP: Steele Sizer, MD  Chief Complaint  Patient presents with  . Mood swings    patient took medication to help with her mood swings a year or more ago, patient had stopped medication and since starting a new job the mood swings have come back  . Bacterial Vaginosis    patient feels that before her mestrual cycle or after she expirences symtpoms of BV, she does not currently have a Obgyn    Subjective:   Teresa [name] is a 19 y.o. female, presents to clinic with CC of the following:  Chief Complaint  Patient presents with  . Mood swings    patient took medication to help with her mood swings a year or more ago, patient had stopped medication and since starting a new job the mood swings have come back  . Bacterial Vaginosis    patient feels that before her mestrual cycle or after she expirences symtpoms of BV, she does not currently have a Obgyn    HPI:  Patient is a 19 y.o. female patient of Dr. Ancil Boozer She was Covid + on testing Dec 11, 2019.   She was seen at the [facility] in Feb 2021 for the following:  Teresa [name] is a 19 y.o. female being seen today for an STI screening visit. The patient reports they do not have symptoms.  Patient reports that they are thinking about  becoming pregnant in the next year.   They reported they are not interested in discussing contraception today.  No LMP recorded. (Menstrual status: Irregular Periods).  She was seen in the ER in Nov 2020, her visit summarized as follows:  Patient's diagnosis is consistent with ongoing vaginal bleeding, pelvic pain, BV.  Patient presented to emergency department with a complaint of intermittent right pelvic pain, ongoing vaginal bleeding x2 weeks.  Patient states that she has been on Depo until recently, has not had a return to her normal menstrual cycle after stopping this form of birth control.  She is  not on birth control currently as she states that she is trying to become pregnant.  Patient has had what she describes as a 2-week long menstrual cycle.  She states that the cramping is not worse than initial psychologist a lot longer.  Patient denied any other vaginal discharge, dysuria, abdominal pain, nausea vomiting, diarrhea or constipation.  Patient did not try any medications at home.  After initial evaluation for pelvic pain and ongoing vaginal bleeding, patient expressed a concern for STD.  Patient does not endorse any specific known contact with an STD but states that she would like to be checked.  This is a send off test and will not return at this time.  Labs returned positive with clue cells.  Patient will be started on Flagyl for BV.  I will prescribe fluconazole to be taken after finishing antibiotic course if patient begins to develop symptoms of candidal infection.  Patient is also given Midol for ongoing pelvic cramps from initial cycle.  Follow-up with primary care or OB/GYN as needed.  Patient is given ED precautions to return to the ED for any worsening or new symptoms.  Last seen at [facility] in October 2020 by Delsa Grana for ankle pain She had a well woman exam with ob/gyn last in early June 2020 Her last visit with Dr. Ancil Boozer was  in July of 2019.  She returns today with mood swing concerns, also BV concerns related to her menstrual cycle.  She notes that she works as a Freight forwarder at [employer], and she had her store changed recently with increased anxiety as a result.  States she had some issues with anxiety and depressive symptoms a few years back, and was on a medicine, but did not like how she felt on that medicine.  She thinks that was Zoloft.  She was doing well off of the medicine until this recent change with work.  She states she is feeling more anxious than depressed, and having more mood swings and being majorly depressed.  She does sometimes have panic episodes where she shakes,  and had to leave work last week 1 time as a result.  Denies any chest pains, shortness of breath, or any history of heart disease.  She noted she did have an abnormality in her thyroid many years ago, in her early teens, although checks after that were good.  She denies any anemia history. TSH and CBC checks from the last 2 to 3 years have been normal.  The last check was in June 2020. She has never had counseling for anxiety or depression. Her PHQ-9 and GAD-7's were reviewed.  She denies any thoughts of hurting self or hurting others. She also noted that intermittently, she has some symptoms of some itch and some burning with urination, sometimes before cycles, sometimes after, and notes the symptoms are very mild.  She is only sexually active with her boyfriend recently, does not have concerns that she has an STD.  She requested taking the medicine she had previously for BV to help eradicate these symptoms.  She has more recently been seen at the health department for GYN concerns, and has not followed up with OB/GYN in her recent past.  She denies any vaginal discharge, abnormal bleeding more recently, and notes when the symptoms occur they are very mild.  She at times will have some cramping in her right suprapubic region, although that is not new.  Patient Active Problem List   Diagnosis Date Noted  . History of suicide attempt 06/01/2016  . MDD (major depressive disorder), recurrent episode, moderate ([place]) 05/31/2016  . History of ankle sprain 09/23/2015  . AD (atopic dermatitis) 05/12/2015  . Family planning 05/12/2015  . Elevated TSH 05/12/2015  . History of chlamydia infection 05/12/2015  . Screening for STD (sexually transmitted disease) 05/12/2015      Current Outpatient Medications:  .  Acetaminophen-Caff-Pyrilamine (MIDOL MAX ST MENSTRUAL) 500-60-15 MG TABS, Take 1 tablet by mouth every 4 (four) hours as needed., Disp: 30 tablet, Rfl: 0 .  metroNIDAZOLE (FLAGYL) 500 MG tablet, Take  1 tablet (500 mg total) by mouth 2 (two) times daily., Disp: 14 tablet, Rfl: 0 .  Multiple Vitamin (MULTIVITAMIN) capsule, Take 1 capsule by mouth daily., Disp: 100 capsule, Rfl: 0   No Known Allergies   Past Surgical History:  Procedure Laterality Date  . DENTAL SURGERY  2019   Dental implant     Family History  Problem Relation Age of Onset  . Obesity Mother      Social History   Tobacco Use  . Smoking status: Never Smoker  . Smokeless tobacco: Never Used  Substance Use Topics  . Alcohol use: No    Alcohol/week: 0.0 standard drinks    With staff assistance, above reviewed with the patient today.  ROS: As per HPI, otherwise no specific  complaints on a limited and focused system review   No results found for this or any previous visit (from the past 72 hour(s)).   PHQ2/9: Depression screen [facility] 2/9 02/12/2020 05/15/2019 11/28/2017 09/29/2016 09/23/2015  Decreased Interest 1 0 1 0 0  Down, Depressed, Hopeless 1 0 0 0 0  PHQ - 2 Score 2 0 1 0 0  Altered sleeping 2 0 1 0 -  Tired, decreased energy 2 0 0 0 -  Change in appetite 1 0 2 0 -  Feeling bad or failure about yourself  0 0 0 0 -  Trouble concentrating 2 0 0 0 -  Moving slowly or fidgety/restless 2 0 0 0 -  Suicidal thoughts 0 0 0 0 -  PHQ-9 Score 11 0 4 0 -  Difficult doing work/chores Somewhat difficult Not difficult at all Not difficult at all - -   PHQ-2/9 Result is reviewed  Fall Risk: Fall Risk  02/12/2020 05/15/2019 09/23/2015 05/12/2015  Falls in the past year? 0 1 No No  Number falls in past yr: 0 0 - -  Injury with Fall? 0 1 - -      GAD 7 : Generalized Anxiety Score 02/12/2020  Nervous, Anxious, on Edge 1  Control/stop worrying 1  Worry too much - different things 2  Trouble relaxing 2  Restless 0  Easily annoyed or irritable 1  Afraid - awful might happen 2  Total GAD 7 Score 9  Anxiety Difficulty Somewhat difficult   GAD7 reviewed   Objective:   Vitals:   02/12/20 1127  BP: 102/64    Pulse: 87  Resp: 16  Temp: 98.5 F (36.9 C)  TempSrc: Temporal  SpO2: 99%  Weight: 140 lb 9.6 oz (63.8 kg)  Height: 5\' 5"  (1.651 m)    Body mass index is 23.4 kg/m.  Physical Exam   NAD, masked, pleasant HEENT - [place]/AT, sclera anicteric, conj - non-inj'ed, pharynx clear Neck - supple, no adenopathy, no TM,  Car - RRR without m/g/r Pulm- RR and effort normal at rest, CTA without wheeze or rales Abd - soft, NT diffusely, nontender in the right suprapubic region to palpation where she occasionally has some crampiness noted Back - no CVA tenderness Ext - no LE edema,  Neuro/psychiatric - affect was not flat, appropriate with conversation  Alert   Grossly non-focal   Speech  Normal, not rapid  Results for orders placed or performed in visit on 09/10/19  WET PREP FOR TRICH, YEAST, CLUE  Result Value Ref Range   Trichomonas Exam Negative Negative   Yeast Exam Negative Negative   Clue Cell Exam Comment: Negative  Gonococcus culture   Specimen: Pharynx; Throat   PH  Result Value Ref Range   GC Culture Only Final report    Result 1 Comment   Gonococcus culture   Specimen: Rectum; Genital   RT  Result Value Ref Range   GC Culture Only Final report    Result 1 Comment    Urine dip -trace protein, negative otherwise    Assessment & Plan:   1. Generalized anxiety disorder with panic episodes  Educated on why daily medication felt best (help lower generalized anxiety level and lessen height of peaks to better control symptoms) Medications reviewed including risk/benefits and potential side effects, mutually agreed to start a Lexapro product-will take half of a 10 mg tablet for 10 to 14 days, and can increase to 1 tablet after if not significantly improved with the  half a tablet daily.  Did warn of potential GI side effects that can occur, and often do go away over time in the first week or 2. Also recommended counseling, with [facility] and [facility] and others in the area noted to her  as options.  Also have a list here can provide to help and recommended she contact her insurance company to help assess which ones may be covered to help with the cost.  Did note to her that the 2 together, counseling and medicines, work better than either alone.  - escitalopram (LEXAPRO) 10 MG tablet; Take 1 tablet (10 mg total) by mouth daily. Take 1/2 tablet daily to start for 10-14 days, can increase to one tablet daily after as directed  Dispense: 30 tablet; Refill: 1  2. Depression with anxiety Her PHQ-9 was positive for some mild depressive symptoms, and she does have a history of major depression in her past. Lexapro also can help with depressive symptoms.  Also counseling as noted above can be helpful and recommended No concerns for HI/SI today.  - escitalopram (LEXAPRO) 10 MG tablet; Take 1 tablet (10 mg total) by mouth daily. Take 1/2 tablet daily to start for 10-14 days, can increase to one tablet daily after as directed  Dispense: 30 tablet; Refill: 1  3. Mood changes As above, the medication and counseling can be helpful - escitalopram (LEXAPRO) 10 MG tablet; Take 1 tablet (10 mg total) by mouth daily. Take 1/2 tablet daily to start for 10-14 days, can increase to one tablet daily after as directed  Dispense: 30 tablet; Refill: 1  4. Dysuria She did note some mild burning with urination intermittently, with her urine dip today unremarkable for any infectious concerns.  She only had trace protein noted. Do not feel sending for a formal urinalysis or culture is needed. - POCT Urinalysis Dipstick  5. BV (bacterial vaginosis) She has a history of this as noted in the HPI, and I did echo concerns with just managing this without further evaluation.  She was convinced that the metronidazole would be helpful and wanted to take, and did not feel unreasonable to do so presently given her history. Emphasized if her symptoms are not completely resolved and stay completely resolved after the  metronidazole course, she needs follow-up again and a referral to gynecology will be recommended at that time, as I did note today the importance if she is having recurrent infectious symptoms, even if low-grade, the importance of being evaluated and managed appropriately and she was understanding of this.  - metroNIDAZOLE (FLAGYL) 500 MG tablet; Take 1 tablet (500 mg total) by mouth 2 (two) times daily.  Dispense: 14 tablet; Refill: 0   Follow-up was recommended in 4 weeks time, sooner as needed.  Discussed potentially repeating some labs today, although she did note she just had some test done at the health department and was not anxious in doing so today, and I did review her last labs in June 2020 with a normal basic metabolic panel, TSH, and CBC noted at that time. May more strongly recommend repeating these at her follow-up visit pending her clinical status.    Towanda Malkin, MD 02/12/20 11:52 AM

## 2020-02-12 ENCOUNTER — Encounter: Payer: Self-pay | Admitting: Internal Medicine

## 2020-02-12 ENCOUNTER — Other Ambulatory Visit: Payer: Self-pay

## 2020-02-12 ENCOUNTER — Ambulatory Visit: Payer: Self-pay | Admitting: Internal Medicine

## 2020-02-12 VITALS — BP 102/64 | HR 87 | Temp 98.5°F | Resp 16 | Ht 65.0 in | Wt 140.6 lb

## 2020-02-12 DIAGNOSIS — B9689 Other specified bacterial agents as the cause of diseases classified elsewhere: Secondary | ICD-10-CM

## 2020-02-12 DIAGNOSIS — N760 Acute vaginitis: Secondary | ICD-10-CM

## 2020-02-12 DIAGNOSIS — F53 Postpartum depression: Secondary | ICD-10-CM | POA: Insufficient documentation

## 2020-02-12 DIAGNOSIS — F411 Generalized anxiety disorder: Secondary | ICD-10-CM

## 2020-02-12 DIAGNOSIS — R300 Dysuria: Secondary | ICD-10-CM

## 2020-02-12 DIAGNOSIS — R4586 Emotional lability: Secondary | ICD-10-CM | POA: Insufficient documentation

## 2020-02-12 DIAGNOSIS — F418 Other specified anxiety disorders: Secondary | ICD-10-CM

## 2020-02-12 HISTORY — DX: Postpartum depression: F53.0

## 2020-02-12 LAB — POCT URINALYSIS DIPSTICK
Appearance: NORMAL
Bilirubin, UA: NEGATIVE
Blood, UA: NEGATIVE
Glucose, UA: NEGATIVE
Ketones, UA: NEGATIVE
Leukocytes, UA: NEGATIVE
Nitrite, UA: NEGATIVE
Odor: NORMAL
Protein, UA: POSITIVE — AB
Spec Grav, UA: 1.015 (ref 1.010–1.025)
Urobilinogen, UA: 0.2 E.U./dL
pH, UA: 8 (ref 5.0–8.0)

## 2020-02-12 MED ORDER — METRONIDAZOLE 500 MG PO TABS: 500.0000 mg | ORAL_TABLET | Freq: Two times a day (BID) | ORAL | 0 refills | Status: DC

## 2020-02-12 MED ORDER — ESCITALOPRAM OXALATE 10 MG PO TABS: 10.0000 mg | ORAL_TABLET | Freq: Every day | ORAL | 1 refills | Status: DC

## 2020-02-12 NOTE — Patient Instructions (Addendum)
Do recommend pursuing counseling as we discussed.  A medication, Lexapro was also prescribed to your pharmacy.  Recommend starting taking half of a tablet for the first 10 to 14 days as we discussed, have not significantly helped with that, can increase to 1 tablet daily  Also prescribed the metronidazole for bacterial vaginosis, as you requested, and do recommend seeing gynecology if any symptoms persist after taking the metronidazole, or if they recur after taking this as well.  We should follow-up again in approximately 4 weeks time

## 2020-03-19 ENCOUNTER — Ambulatory Visit: Payer: Self-pay | Admitting: Family Medicine

## 2020-03-27 ENCOUNTER — Ambulatory Visit: Payer: Medicaid Other

## 2020-04-01 ENCOUNTER — Other Ambulatory Visit: Payer: Self-pay

## 2020-04-01 ENCOUNTER — Ambulatory Visit: Payer: Medicaid Other | Admitting: Family Medicine

## 2020-04-01 DIAGNOSIS — Z113 Encounter for screening for infections with a predominantly sexual mode of transmission: Secondary | ICD-10-CM | POA: Diagnosis not present

## 2020-04-01 NOTE — Progress Notes (Signed)
Provider orders completed.

## 2020-04-01 NOTE — Progress Notes (Signed)
S: Client her for STD testing only- specifically GC/Chlamydia. Declines bloodwork, interview, or exam.  States she has to be at work in [place] before 5 pm and is unable to stay.  She will do self-collect testing. O; declines exam A: STD screen P:  GC/Chalmydia pending. Co client that is testing is + she 'll be notified for treatment. Co. for client to return to clinic to have  complete STD Screen at another date.

## 2020-04-02 ENCOUNTER — Telehealth: Payer: Self-pay | Admitting: Family Medicine

## 2020-04-02 NOTE — Telephone Encounter (Signed)
PLS CALL REGARDING YESTERDAYS VISIT

## 2020-04-02 NOTE — Telephone Encounter (Signed)
Phone call to pt. Pt states she received a phone call yesterday from sex partner stating they had a positive chlamydia test. Pt scheduled in provider clinic to make sure provider has opportunity to complete STI requirements, address pt concerns, and examine the pt (pt did a self collect on 04/01/20). Pt schduled for 04/03/20 provider visit and instructed to eat a meal before coming in for appt.

## 2020-04-03 ENCOUNTER — Ambulatory Visit: Payer: Self-pay

## 2020-04-08 ENCOUNTER — Ambulatory Visit: Payer: Self-pay

## 2020-04-09 ENCOUNTER — Ambulatory Visit: Payer: Medicaid Other

## 2020-04-24 DIAGNOSIS — J069 Acute upper respiratory infection, unspecified: Secondary | ICD-10-CM | POA: Diagnosis not present

## 2020-05-14 ENCOUNTER — Other Ambulatory Visit (HOSPITAL_COMMUNITY)
Admission: RE | Admit: 2020-05-14 | Discharge: 2020-05-14 | Disposition: A | Discharge status: Home / Self Care | Payer: Medicaid Other | Source: Ambulatory Visit | Attending: Obstetrics and Gynecology | Admitting: Obstetrics and Gynecology

## 2020-05-14 ENCOUNTER — Ambulatory Visit (INDEPENDENT_AMBULATORY_CARE_PROVIDER_SITE_OTHER): Payer: Medicaid Other | Admitting: Obstetrics and Gynecology

## 2020-05-14 ENCOUNTER — Other Ambulatory Visit: Payer: Self-pay

## 2020-05-14 ENCOUNTER — Encounter: Payer: Self-pay | Admitting: Obstetrics and Gynecology

## 2020-05-14 VITALS — BP 99/67 | HR 72 | Wt 137.0 lb

## 2020-05-14 DIAGNOSIS — Z3401 Encounter for supervision of normal first pregnancy, first trimester: Secondary | ICD-10-CM

## 2020-05-14 DIAGNOSIS — N912 Amenorrhea, unspecified: Secondary | ICD-10-CM

## 2020-05-14 DIAGNOSIS — Z3400 Encounter for supervision of normal first pregnancy, unspecified trimester: Secondary | ICD-10-CM

## 2020-05-14 DIAGNOSIS — Z3A01 Less than 8 weeks gestation of pregnancy: Secondary | ICD-10-CM

## 2020-05-14 HISTORY — DX: Encounter for supervision of normal first pregnancy, unspecified trimester: Z34.00

## 2020-05-14 LAB — OB RESULTS CONSOLE VARICELLA ZOSTER ANTIBODY, IGG: Varicella: IMMUNE

## 2020-05-14 LAB — POCT URINE PREGNANCY: Preg Test, Ur: POSITIVE — AB

## 2020-05-14 LAB — OB RESULTS CONSOLE GC/CHLAMYDIA: Gonorrhea: NEGATIVE
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
## 2020-05-15 LAB — TSH+FREE T4
Free T4: 1.07 ng/dL (ref 0.93–1.60)
TSH: 2.67 u[IU]/mL (ref 0.450–4.500)

## 2020-05-15 LAB — RPR+RH+ABO+RUB AB+AB SCR+CB...
Antibody Screen: NEGATIVE
HIV Screen 4th Generation wRfx: NONREACTIVE
Hematocrit: 35.5 % (ref 34.0–46.6)
Hemoglobin: 12.6 g/dL (ref 11.1–15.9)
Hepatitis B Surface Ag: NEGATIVE
MCH: 33.1 pg — ABNORMAL HIGH (ref 26.6–33.0)
MCHC: 35.5 g/dL (ref 31.5–35.7)
MCV: 93 fL (ref 79–97)
Platelets: 200 10*3/uL (ref 150–450)
RBC: 3.81 x10E6/uL (ref 3.77–5.28)
RDW: 12 % (ref 11.7–15.4)
RPR Ser Ql: NONREACTIVE
Rh Factor: POSITIVE
Rubella Antibodies, IGG: 1.11 index (ref >0.99)
Varicella zoster IgG: 419 index (ref >165)
WBC: 9.6 10*3/uL (ref 3.4–10.8)

## 2020-05-16 LAB — CERVICOVAGINAL ANCILLARY ONLY
Chlamydia: NEGATIVE
Comment: NEGATIVE
Comment: NEGATIVE
Comment: NORMAL
Neisseria Gonorrhea: NEGATIVE
Trichomonas: NEGATIVE

## 2020-05-17 LAB — URINE CULTURE

## 2020-05-21 ENCOUNTER — Ambulatory Visit (INDEPENDENT_AMBULATORY_CARE_PROVIDER_SITE_OTHER): Payer: Medicaid Other | Admitting: Obstetrics & Gynecology

## 2020-05-21 ENCOUNTER — Encounter: Payer: Self-pay | Admitting: Obstetrics & Gynecology

## 2020-05-21 ENCOUNTER — Other Ambulatory Visit: Payer: Self-pay | Admitting: Obstetrics and Gynecology

## 2020-05-21 ENCOUNTER — Other Ambulatory Visit: Payer: Self-pay

## 2020-05-21 ENCOUNTER — Ambulatory Visit (INDEPENDENT_AMBULATORY_CARE_PROVIDER_SITE_OTHER): Payer: Medicaid Other

## 2020-05-21 VITALS — BP 100/60 | Wt 137.0 lb

## 2020-05-21 DIAGNOSIS — Z3401 Encounter for supervision of normal first pregnancy, first trimester: Secondary | ICD-10-CM

## 2020-05-21 DIAGNOSIS — Z3A01 Less than 8 weeks gestation of pregnancy: Secondary | ICD-10-CM

## 2020-05-21 LAB — POCT URINALYSIS DIPSTICK OB
Glucose, UA: NEGATIVE
POC,PROTEIN,UA: NEGATIVE

## 2020-05-21 NOTE — Progress Notes (Signed)
  Subjective  No pain bleeding or nausea  Objective  BP 100/60   Wt 137 lb (62.1 kg)   LMP 03/22/2020   BMI 22.80 kg/m  General: NAD Pumonary: no increased work of breathing Abdomen: gravid, non-tender Extremities: no edema Psychiatric: mood appropriate, affect full  Assessment  19 y.o. G1P0000 at [redacted]w[redacted]d by  01/02/2021, by Ultrasound presenting for routine prenatal visit  Plan   Problem List Items Addressed This Visit      Other   Supervision of low-risk first pregnancy    Other Visit Diagnoses    [redacted] weeks gestation of pregnancy    -  Primary      Pregnancy#1 Problems (from 03/22/20 to present)    Problem Noted Resolved   Supervision of low-risk first pregnancy 05/14/2020 by Will Bonnet, MD No   Overview Signed 05/21/2020  2:12 PM by Gae Dry, MD    Clinic [facility] Prenatal Labs  Dating [place] 7 weeks Blood type: O/Positive/-- (10/06 1430)   Genetic Screen NIPS: Antibody:Negative (10/06 1430)  Anatomic [place]  Rubella: 1.11 (10/06 1430) Varicella: Immune  GTT  Third trimester:  RPR: Non Reactive (10/06 1430)   Rhogam n/a HBsAg: Negative (10/06 1430)   TDaP vaccine                       Flu Shot: HIV: Non Reactive (10/06 1430)   Baby Food                                GBS:   Contraception  Pap:n/a  CBB  no   CS/VBAC n/a   Support Person              Desires NIPT, nv  [place] d/w pt, results reassuring, EDC changed (dating was an estimation of LMP)  PNV  Barnett Applebaum, MD, [facility], [facility] 05/21/2020  2:12 PM

## 2020-05-21 NOTE — Patient Instructions (Signed)

## 2020-05-21 NOTE — Addendum Note (Signed)
Addended by: Quintella Baton D on: 05/21/2020 02:17 PM   Modules accepted: Orders

## 2020-05-23 LAB — URINE DRUG PANEL 7
Amphetamines, Urine: NEGATIVE ng/mL
Barbiturate Quant, Ur: NEGATIVE ng/mL
Benzodiazepine Quant, Ur: NEGATIVE ng/mL
Cannabinoid Quant, Ur: POSITIVE — AB
Cocaine (Metab.): NEGATIVE ng/mL
Opiate Quant, Ur: NEGATIVE ng/mL
PCP Quant, Ur: NEGATIVE ng/mL

## 2020-05-26 ENCOUNTER — Telehealth: Payer: Self-pay

## 2020-05-26 NOTE — Telephone Encounter (Signed)
Pt called triage with some questions about her next [place] apt 11/10. Tried to call pt back. Went straight to VM. LM. Will send her a message on mychart as well

## 2020-05-26 NOTE — Telephone Encounter (Signed)
Patient is calling back to due to missed call please advise

## 2020-05-27 NOTE — Telephone Encounter (Signed)
Contacted through mychart

## 2020-06-12 ENCOUNTER — Ambulatory Visit (INDEPENDENT_AMBULATORY_CARE_PROVIDER_SITE_OTHER): Payer: Medicaid Other | Admitting: Obstetrics and Gynecology

## 2020-06-12 ENCOUNTER — Other Ambulatory Visit: Payer: Self-pay

## 2020-06-12 ENCOUNTER — Encounter: Payer: Self-pay | Admitting: Obstetrics and Gynecology

## 2020-06-12 VITALS — BP 100/70 | Ht 66.0 in | Wt 138.6 lb

## 2020-06-12 DIAGNOSIS — Z1379 Encounter for other screening for genetic and chromosomal anomalies: Secondary | ICD-10-CM

## 2020-06-12 DIAGNOSIS — Z3A10 10 weeks gestation of pregnancy: Secondary | ICD-10-CM

## 2020-06-12 DIAGNOSIS — Z3401 Encounter for supervision of normal first pregnancy, first trimester: Secondary | ICD-10-CM

## 2020-06-12 LAB — POCT URINALYSIS DIPSTICK OB
Glucose, UA: NEGATIVE
POC,PROTEIN,UA: NEGATIVE

## 2020-06-12 NOTE — Progress Notes (Signed)
    Routine Prenatal Care Visit  Subjective  Teresa [name] is a 19 y.o. G1P0000 at [redacted]w[redacted]d being seen today for ongoing prenatal care.  She is currently monitored for the following issues for this low-risk pregnancy and has AD (atopic dermatitis); Family planning; Elevated TSH; History of chlamydia infection; Screening for STD (sexually transmitted disease); History of ankle sprain; MDD (major depressive disorder), recurrent episode, moderate ([place]); History of suicide attempt; Generalized anxiety disorder; Depression with anxiety; Mood changes; BV (bacterial vaginosis); and Supervision of low-risk first pregnancy on their problem list.  ----------------------------------------------------------------------------------- Patient reports no complaints.   Contractions: Not present. Vag. Bleeding: None.  Movement: Absent. Denies leaking of fluid.  ----------------------------------------------------------------------------------- The following portions of the patient's history were reviewed and updated as appropriate: allergies, current medications, past family history, past medical history, past social history, past surgical history and problem list. Problem list updated.   Objective  Blood pressure 100/70, height 5\' 6"  (1.676 m), weight 138 lb 9.6 oz (62.9 kg), last menstrual period 03/22/2020. Pregravid weight 133 lb (60.3 kg) Total Weight Gain 5 lb 9.6 oz (2.54 kg) Urinalysis:      Fetal Status: Fetal Heart Rate (bpm): 170   Movement: Absent     General:  Alert, oriented and cooperative. Patient is in no acute distress.  Skin: Skin is warm and dry. No rash noted.   Cardiovascular: Normal heart rate noted  Respiratory: Normal respiratory effort, no problems with respiration noted  Abdomen: Soft, gravid, appropriate for gestational age. Pain/Pressure: Absent     Pelvic:  Cervical exam deferred        Extremities: Normal range of motion.  Edema: None  Mental Status: Normal mood and affect.  Normal behavior. Normal judgment and thought content.     Assessment   19 y.o. G1P0000 at [redacted]w[redacted]d by  01/02/2021, by Ultrasound presenting for routine prenatal visit  Plan   Pregnancy#1 Problems (from 03/22/20 to present)    Problem Noted Resolved   Supervision of low-risk first pregnancy 05/14/2020 by Will Bonnet, MD No   Overview Signed 05/21/2020  2:12 PM by Gae Dry, MD    [facility] Prenatal Labs  Dating [place] 7 weeks Blood type: O/Positive/-- (10/06 1430)   Genetic Screen NIPS: Antibody:Negative (10/06 1430)  Anatomic [place]  Rubella: 1.11 (10/06 1430) Varicella: Immune  GTT  Third trimester:  RPR: Non Reactive (10/06 1430)   Rhogam n/a HBsAg: Negative (10/06 1430)   TDaP vaccine                       Flu Shot: HIV: Non Reactive (10/06 1430)   Baby Food                                GBS:   Contraception  Pap:n/a  CBB  no   CS/VBAC n/a   Support Person               Maternit21 testing today- desires gender envelope Discussed exercise in pregnancy  Gestational age appropriate obstetric precautions including but not limited to vaginal bleeding, contractions, leaking of fluid and fetal movement were reviewed in detail with the patient.    Return in about 3 weeks (around 07/03/2020) for ROB in person.  Homero Fellers MD [facility], [facility] 06/12/2020, 5:00 PM

## 2020-06-12 NOTE — Patient Instructions (Addendum)
First Trimester of Pregnancy The first trimester of pregnancy is from week 1 until the end of week 13 (months 1 through 3). A week after a sperm fertilizes an egg, the egg will implant on the wall of the uterus. This embryo will begin to develop into a baby. Genes from you and your partner will form the baby. The female genes will determine whether the baby will be a boy or a girl. At 6-8 weeks, the eyes and face will be formed, and the heartbeat can be seen on ultrasound. At the end of 12 weeks, all the baby's organs will be formed. Now that you are pregnant, you will want to do everything you can to have a healthy baby. Two of the most important things are to get good prenatal care and to follow your health care provider's instructions. Prenatal care is all the medical care you receive before the baby's birth. This care will help prevent, find, and treat any problems during the pregnancy and childbirth. Body changes during your first trimester Your body goes through many changes during pregnancy. The changes vary from woman to woman.  You may gain or lose a couple of pounds at first.  You may feel sick to your stomach (nauseous) and you may throw up (vomit). If the vomiting is uncontrollable, call your health care provider.  You may tire easily.  You may develop headaches that can be relieved by medicines. All medicines should be approved by your health care provider.  You may urinate more often. Painful urination may mean you have a bladder infection.  You may develop heartburn as a result of your pregnancy.  You may develop constipation because certain hormones are causing the muscles that push stool through your intestines to slow down.  You may develop hemorrhoids or swollen veins (varicose veins).  Your breasts may begin to grow larger and become tender. Your nipples may stick out more, and the tissue that surrounds them (areola) may become darker.  Your gums may bleed and may be  sensitive to brushing and flossing.  Dark spots or blotches (chloasma, mask of pregnancy) may develop on your face. This will likely fade after the baby is born.  Your menstrual periods will stop.  You may have a loss of appetite.  You may develop cravings for certain kinds of food.  You may have changes in your emotions from day to day, such as being excited to be pregnant or being concerned that something may go wrong with the pregnancy and baby.  You may have more vivid and strange dreams.  You may have changes in your hair. These can include thickening of your hair, rapid growth, and changes in texture. Some women also have hair loss during or after pregnancy, or hair that feels dry or thin. Your hair will most likely return to normal after your baby is born. What to expect at prenatal visits During a routine prenatal visit:  You will be weighed to make sure you and the baby are growing normally.  Your blood pressure will be taken.  Your abdomen will be measured to track your baby's growth.  The fetal heartbeat will be listened to between weeks 10 and 14 of your pregnancy.  Test results from any previous visits will be discussed. Your health care provider may ask you:  How you are feeling.  If you are feeling the baby move.  If you have had any abnormal symptoms, such as leaking fluid, bleeding, severe headaches, or abdominal   cramping.  If you are using any tobacco products, including cigarettes, chewing tobacco, and electronic cigarettes.  If you have any questions. Other tests that may be performed during your first trimester include:  Blood tests to find your blood type and to check for the presence of any previous infections. The tests will also be used to check for low iron levels (anemia) and protein on red blood cells (Rh antibodies). Depending on your risk factors, or if you previously had diabetes during pregnancy, you may have tests to check for high blood sugar  that affects pregnant women (gestational diabetes).  Urine tests to check for infections, diabetes, or protein in the urine.  An ultrasound to confirm the proper growth and development of the baby.  Fetal screens for spinal cord problems (spina bifida) and Down syndrome.  HIV (human immunodeficiency virus) testing. Routine prenatal testing includes screening for HIV, unless you choose not to have this test.  You may need other tests to make sure you and the baby are doing well. Follow these instructions at home: Medicines  Follow your health care provider's instructions regarding medicine use. Specific medicines may be either safe or unsafe to take during pregnancy.  Take a prenatal vitamin that contains at least 600 micrograms (mcg) of folic acid.  If you develop constipation, try taking a stool softener if your health care provider approves. Eating and drinking   Eat a balanced diet that includes fresh fruits and vegetables, whole grains, good sources of protein such as meat, eggs, or tofu, and low-fat dairy. Your health care provider will help you determine the amount of weight gain that is right for you.  Avoid raw meat and uncooked cheese. These carry germs that can cause birth defects in the baby.  Eating four or five small meals rather than three large meals a day may help relieve nausea and vomiting. If you start to feel nauseous, eating a few soda crackers can be helpful. Drinking liquids between meals, instead of during meals, also seems to help ease nausea and vomiting.  Limit foods that are high in fat and processed sugars, such as fried and sweet foods.  To prevent constipation: ? Eat foods that are high in fiber, such as fresh fruits and vegetables, whole grains, and beans. ? Drink enough fluid to keep your urine clear or pale yellow. Activity  Exercise only as directed by your health care provider. Most women can continue their usual exercise routine during  pregnancy. Try to exercise for 30 minutes at least 5 days a week. Exercising will help you: ? Control your weight. ? Stay in shape. ? Be prepared for labor and delivery.  Experiencing pain or cramping in the lower abdomen or lower back is a good sign that you should stop exercising. Check with your health care provider before continuing with normal exercises.  Try to avoid standing for long periods of time. Move your legs often if you must stand in one place for a long time.  Avoid heavy lifting.  Wear low-heeled shoes and practice good posture.  You may continue to have sex unless your health care provider tells you not to. Relieving pain and discomfort  Wear a good support bra to relieve breast tenderness.  Take warm sitz baths to soothe any pain or discomfort caused by hemorrhoids. Use hemorrhoid cream if your health care provider approves.  Rest with your legs elevated if you have leg cramps or low back pain.  If you develop varicose veins in   your legs, wear support hose. Elevate your feet for 15 minutes, 3-4 times a day. Limit salt in your diet. Prenatal care  Schedule your prenatal visits by the twelfth week of pregnancy. They are usually scheduled monthly at first, then more often in the last 2 months before delivery.  Write down your questions. Take them to your prenatal visits.  Keep all your prenatal visits as told by your health care provider. This is important. Safety  Wear your seat belt at all times when driving.  Make a list of emergency phone numbers, including numbers for family, friends, the hospital, and police and fire departments. General instructions  Ask your health care provider for a referral to a local prenatal education class. Begin classes no later than the beginning of month 6 of your pregnancy.  Ask for help if you have counseling or nutritional needs during pregnancy. Your health care provider can offer advice or refer you to specialists for help  with various needs.  Do not use hot tubs, steam rooms, or saunas.  Do not douche or use tampons or scented sanitary pads.  Do not cross your legs for long periods of time.  Avoid cat litter boxes and soil used by cats. These carry germs that can cause birth defects in the baby and possibly loss of the fetus by miscarriage or stillbirth.  Avoid all smoking, herbs, alcohol, and medicines not prescribed by your health care provider. Chemicals in these products affect the formation and growth of the baby.  Do not use any products that contain nicotine or tobacco, such as cigarettes and e-cigarettes. If you need help quitting, ask your health care provider. You may receive counseling support and other resources to help you quit.  Schedule a dentist appointment. At home, brush your teeth with a soft toothbrush and be gentle when you floss. Contact a health care provider if:  You have dizziness.  You have mild pelvic cramps, pelvic pressure, or nagging pain in the abdominal area.  You have persistent nausea, vomiting, or diarrhea.  You have a bad smelling vaginal discharge.  You have pain when you urinate.  You notice increased swelling in your face, hands, legs, or ankles.  You are exposed to fifth disease or chickenpox.  You are exposed to German measles (rubella) and have never had it. Get help right away if:  You have a fever.  You are leaking fluid from your vagina.  You have spotting or bleeding from your vagina.  You have severe abdominal cramping or pain.  You have rapid weight gain or loss.  You vomit blood or material that looks like coffee grounds.  You develop a severe headache.  You have shortness of breath.  You have any kind of trauma, such as from a fall or a car accident. Summary  The first trimester of pregnancy is from week 1 until the end of week 13 (months 1 through 3).  Your body goes through many changes during pregnancy. The changes vary from  woman to woman.  You will have routine prenatal visits. During those visits, your health care provider will examine you, discuss any test results you may have, and talk with you about how you are feeling. This information is not intended to replace advice given to you by your health care provider. Make sure you discuss any questions you have with your health care provider. Document Revised: 07/08/2017 Document Reviewed: 07/07/2016 Elsevier Patient Education  2020 [employer]  

## 2020-06-13 ENCOUNTER — Encounter: Payer: Medicaid Other | Admitting: Obstetrics and Gynecology

## 2020-06-17 LAB — MATERNIT21 PLUS CORE+SCA
Fetal Fraction: 14
Monosomy X (Turner Syndrome): NOT DETECTED
Result (T21): NEGATIVE
Trisomy 13 (Patau syndrome): NEGATIVE
Trisomy 18 (Edwards syndrome): NEGATIVE
Trisomy 21 (Down syndrome): NEGATIVE
XXX (Triple X Syndrome): NOT DETECTED
XXY (Klinefelter Syndrome): NOT DETECTED
XYY (Jacobs Syndrome): NOT DETECTED

## 2020-06-18 ENCOUNTER — Encounter: Payer: Medicaid Other | Admitting: Obstetrics and Gynecology

## 2020-06-18 NOTE — Telephone Encounter (Signed)
Please record her covid immunization in the chart. Thank you!

## 2020-06-30 ENCOUNTER — Encounter: Payer: Medicaid Other | Admitting: Obstetrics

## 2020-07-01 ENCOUNTER — Ambulatory Visit (INDEPENDENT_AMBULATORY_CARE_PROVIDER_SITE_OTHER): Payer: Medicaid Other | Admitting: Obstetrics

## 2020-07-01 ENCOUNTER — Other Ambulatory Visit: Payer: Self-pay

## 2020-07-01 VITALS — BP 100/70 | Wt 141.0 lb

## 2020-07-01 DIAGNOSIS — O0990 Supervision of high risk pregnancy, unspecified, unspecified trimester: Secondary | ICD-10-CM

## 2020-07-01 DIAGNOSIS — Z3A13 13 weeks gestation of pregnancy: Secondary | ICD-10-CM

## 2020-07-01 NOTE — Progress Notes (Signed)
  Routine Prenatal Care Visit  Subjective  Teresa [name] is a 19 y.o. G1P0000 at [redacted]w[redacted]d being seen today for ongoing prenatal care.  She is currently monitored for the following issues for this low-risk pregnancy and has AD (atopic dermatitis); Family planning; Elevated TSH; History of chlamydia infection; Screening for STD (sexually transmitted disease); History of ankle sprain; MDD (major depressive disorder), recurrent episode, moderate ([place]); History of suicide attempt; Generalized anxiety disorder; Depression with anxiety; Mood changes; BV (bacterial vaginosis); and Supervision of low-risk first pregnancy on their problem list.  ----------------------------------------------------------------------------------- Patient reports no complaints.    .  .   Teresa [name] Fluid denies.  ----------------------------------------------------------------------------------- The following portions of the patient's history were reviewed and updated as appropriate: allergies, current medications, past family history, past medical history, past social history, past surgical history and problem list. Problem list updated.  Objective  Blood pressure 100/70, weight 141 lb (64 kg), last menstrual period 03/22/2020. Pregravid weight 133 lb (60.3 kg) Total Weight Gain 8 lb (3.629 kg) Urinalysis: Urine Protein    Urine Glucose    Fetal Status:           General:  Alert, oriented and cooperative. Patient is in no acute distress.  Skin: Skin is warm and dry. No rash noted.   Cardiovascular: Normal heart rate noted  Respiratory: Normal respiratory effort, no problems with respiration noted  Abdomen: Soft, gravid, appropriate for gestational age.       Pelvic:  Cervical exam deferred        Extremities: Normal range of motion.     Mental Status: Normal mood and affect. Normal behavior. Normal judgment and thought content.   Assessment   19 y.o. G1P0000 at [redacted]w[redacted]d by  01/02/2021, by Ultrasound presenting for routine  prenatal visit  Plan   Pregnancy#1 Problems (from 03/22/20 to present)    Problem Noted Resolved   Supervision of low-risk first pregnancy 05/14/2020 by Will Bonnet, MD No   Overview Signed 05/21/2020  2:12 PM by Gae Dry, MD    [facility] Prenatal Labs  Dating [place] 7 weeks Blood type: O/Positive/-- (10/06 1430)   Genetic Screen NIPS: Antibody:Negative (10/06 1430)  Anatomic [place]  Rubella: 1.11 (10/06 1430) Varicella: Immune  GTT  Third trimester:  RPR: Non Reactive (10/06 1430)   Rhogam n/a HBsAg: Negative (10/06 1430)   TDaP vaccine                       Flu Shot: HIV: Non Reactive (10/06 1430)   Baby Food                                GBS:   Contraception  Pap:n/a  CBB  no   CS/VBAC n/a   Support Person                Preterm labor symptoms and general obstetric precautions including but not limited to vaginal bleeding, contractions, leaking of fluid and fetal movement were reviewed in detail with the patient. Please refer to After Visit Summary for other counseling recommendations.   Return in about 4 weeks (around 07/29/2020) for return OB.  Imagene Riches, CNM  07/01/2020 4:12 PM

## 2020-07-01 NOTE — Progress Notes (Signed)
No concerns.rj 

## 2020-07-09 ENCOUNTER — Telehealth: Payer: Self-pay

## 2020-07-09 NOTE — Telephone Encounter (Signed)
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 The last time she worked a double, her ankles swelled really bad. She is requesting a note stating that she shouldn't work long shifts as this makes her really miserable. Cb#[phone]

## 2020-07-09 NOTE — Telephone Encounter (Signed)
Per Darrel Hoover, CNM. Ok to send general OB retrictions letter. Patient aware she will get this through my chart. She will let us know if she needs further assitance. She states that a double is a 9 hours shift. I read the restrictions to her and she believes this should take care of her needs.

## 2020-07-28 ENCOUNTER — Other Ambulatory Visit: Payer: Self-pay

## 2020-07-28 ENCOUNTER — Ambulatory Visit (INDEPENDENT_AMBULATORY_CARE_PROVIDER_SITE_OTHER): Payer: Medicaid Other | Admitting: Obstetrics and Gynecology

## 2020-07-28 VITALS — BP 112/68 | Wt 149.4 lb

## 2020-07-28 DIAGNOSIS — Z3401 Encounter for supervision of normal first pregnancy, first trimester: Secondary | ICD-10-CM

## 2020-07-28 DIAGNOSIS — Z363 Encounter for antenatal screening for malformations: Secondary | ICD-10-CM

## 2020-07-28 DIAGNOSIS — Z3A17 17 weeks gestation of pregnancy: Secondary | ICD-10-CM

## 2020-07-28 NOTE — Progress Notes (Signed)
    Routine Prenatal Care Visit  Subjective  Teresa [name] is a 19 y.o. G1P0000 at [redacted]w[redacted]d being seen today for ongoing prenatal care.  She is currently monitored for the following issues for this low-risk pregnancy and has AD (atopic dermatitis); Family planning; Elevated TSH; History of chlamydia infection; Screening for STD (sexually transmitted disease); History of ankle sprain; MDD (major depressive disorder), recurrent episode, moderate ([place]); History of suicide attempt; Generalized anxiety disorder; Depression with anxiety; Mood changes; BV (bacterial vaginosis); and Supervision of low-risk first pregnancy on their problem list.  ----------------------------------------------------------------------------------- Patient reports no complaints.    .  .   . Denies leaking of fluid.  ----------------------------------------------------------------------------------- The following portions of the patient's history were reviewed and updated as appropriate: allergies, current medications, past family history, past medical history, past social history, past surgical history and problem list. Problem list updated.   Objective  Blood pressure 112/68, weight 149 lb 6 oz (67.8 kg), last menstrual period 03/22/2020. Pregravid weight 133 lb (60.3 kg) Total Weight Gain 16 lb 6 oz (7.428 kg) Urinalysis:      Fetal Status:           General:  Alert, oriented and cooperative. Patient is in no acute distress.  Skin: Skin is warm and dry. No rash noted.   Cardiovascular: Normal heart rate noted  Respiratory: Normal respiratory effort, no problems with respiration noted  Abdomen: Soft, gravid, appropriate for gestational age.       Pelvic:  Cervical exam deferred        Extremities: Normal range of motion.     ental Status: Normal mood and affect. Normal behavior. Normal judgment and thought content.     Assessment   19 y.o. G1P0000 at [redacted]w[redacted]d by  01/02/2021, by Ultrasound presenting for routine  prenatal visit  Plan   Pregnancy#1 Problems (from 03/22/20 to present)    Problem Noted Resolved   Supervision of low-risk first pregnancy 05/14/2020 by Will Bonnet, MD No   Overview Signed 05/21/2020  2:12 PM by Gae Dry, MD    [facility] Prenatal Labs  Dating [place] 7 weeks Blood type: O/Positive/-- (10/06 1430)   Genetic Screen NIPS: Antibody:Negative (10/06 1430)  Anatomic [place]  Rubella: 1.11 (10/06 1430) Varicella: Immune  GTT  Third trimester:  RPR: Non Reactive (10/06 1430)   Rhogam n/a HBsAg: Negative (10/06 1430)   TDaP vaccine                       Flu Shot: HIV: Non Reactive (10/06 1430)   Baby Food                                GBS:   Contraception  Pap:n/a  CBB  no   CS/VBAC n/a   Support Person                Gestational age appropriate obstetric precautions including but not limited to vaginal bleeding, contractions, leaking of fluid and fetal movement were reviewed in detail with the patient.    Return in about 3 weeks (around 08/18/2020) for ROB and antomy scan.  Malachy Mood, MD, [facility], [facility] 07/28/2020, 3:55 PM

## 2020-08-09 DIAGNOSIS — I454 Nonspecific intraventricular block: Secondary | ICD-10-CM | POA: Diagnosis not present

## 2020-08-09 DIAGNOSIS — R059 Cough, unspecified: Secondary | ICD-10-CM | POA: Diagnosis not present

## 2020-08-09 DIAGNOSIS — Z3A19 19 weeks gestation of pregnancy: Secondary | ICD-10-CM | POA: Diagnosis not present

## 2020-08-09 DIAGNOSIS — R000 Tachycardia, unspecified: Secondary | ICD-10-CM | POA: Diagnosis not present

## 2020-08-09 DIAGNOSIS — U071 COVID-19: Secondary | ICD-10-CM | POA: Diagnosis not present

## 2020-08-09 DIAGNOSIS — R509 Fever, unspecified: Secondary | ICD-10-CM | POA: Diagnosis not present

## 2020-08-09 DIAGNOSIS — O99512 Diseases of the respiratory system complicating pregnancy, second trimester: Secondary | ICD-10-CM | POA: Diagnosis not present

## 2020-08-09 NOTE — L&D Delivery Note (Addendum)
PREOPERATIVE DIAGNOSES: 1. Term pregnancy at [redacted]wks Gestational Age 21. Maternal Exhaustion    POSTOPERATIVE DIAGNOSES: 1. Term pregnancy at [redacted]wks Gestational Age 21. Live, Viable female infant 3. Maternal Exhaustion    OPERATION PERFORMED: Vacuum-Assisted Vaginal Delivery Repair of third degree (partial, <50% sphincter)   SURGEON: Barnett Applebaum, MD   ANESTHESIA: Epidural   ESTIMATED BLOOD LOSS: 200 cc   FINDINGS: Delivered a female infant "Karsen" weighing 8lbs 8oz with Apgars 8 and 9, three-vessel cord and normal placenta.   COMPLICATIONS: None   SITUATION:  The options for labor management at his time were discussed with the patient and her partner, as were the delivery options including a Kiwi vacuum delivery. The pros and cons and the risks of the vacuum delivery were discussed in detail, as were the alternative approaches. The patient and her partner decided to proceed with a Kiwi vacuum delivery.    DESCRIPTION OF THE PROCEDURE:    The baby's head was confirmed to be in the OA presentation with 100% effacement and +1 station. The bladder was drained. The vacuum was placed and the correct placement in front of the posterior fontanelle was confirmed digitally. With the patient's next contraction, the vacuum was inflated and a gentle pressure was used to assist with maternal pushes to deliver the baby's head. In total there were 1pulls and 0 pop-offs. The vacuum was released between contractions. With crowning of the head the perineum was injected with 1% lidocaine.  After delivery of the head, the vacuum was deflated and removed. There was a single nuchal cord.  The anterior  shoulder was delivered with gentle downward guidance followed by delivery of the posterior shoulder with gentle upward guidance. The infant was placed on the maternal chest.  The cord was clamped x2 and cut. The infant was handed to the NICU attendants.   Pitocin was added to the patient's IV fluids. The placenta  delivered spontaneously, was intact and had a three-vessel cord. A vaginal inspection revealed 3rd degree (partial) perineal lacetation. The laceration  was repaired with #2-0Vicryl suture in a normal fashion with local anesthesia.    At the end of the delivery mom and baby were recovering in stable condition.  Sponge, instrument, and needle counts were correct times two.   Barnett Applebaum, MD, [facility], [facility] 12/29/2020  2:56 PM

## 2020-08-19 ENCOUNTER — Ambulatory Visit (INDEPENDENT_AMBULATORY_CARE_PROVIDER_SITE_OTHER): Payer: Medicaid Other

## 2020-08-19 ENCOUNTER — Other Ambulatory Visit: Payer: Self-pay

## 2020-08-19 ENCOUNTER — Ambulatory Visit (INDEPENDENT_AMBULATORY_CARE_PROVIDER_SITE_OTHER): Payer: Medicaid Other | Admitting: Obstetrics

## 2020-08-19 VITALS — BP 100/66 | Wt 154.0 lb

## 2020-08-19 DIAGNOSIS — Z3402 Encounter for supervision of normal first pregnancy, second trimester: Secondary | ICD-10-CM

## 2020-08-19 DIAGNOSIS — Z3A20 20 weeks gestation of pregnancy: Secondary | ICD-10-CM

## 2020-08-19 DIAGNOSIS — Z3401 Encounter for supervision of normal first pregnancy, first trimester: Secondary | ICD-10-CM

## 2020-08-19 DIAGNOSIS — Z363 Encounter for antenatal screening for malformations: Secondary | ICD-10-CM

## 2020-08-19 LAB — POCT URINALYSIS DIPSTICK OB
Glucose, UA: NEGATIVE
POC,PROTEIN,UA: NEGATIVE

## 2020-08-19 NOTE — Progress Notes (Signed)
Struggling with laryngitis since having covid 2 weeks ago.

## 2020-08-19 NOTE — Progress Notes (Signed)
Routine Prenatal Care Visit  Subjective  Teresa [name] is a 20 y.o. G1P0000 at [redacted]w[redacted]d being seen today for ongoing prenatal care.  She is currently monitored for the following issues for this high-risk pregnancy and has AD (atopic dermatitis); Family planning; Elevated TSH; History of chlamydia infection; Screening for STD (sexually transmitted disease); History of ankle sprain; MDD (major depressive disorder), recurrent episode, moderate ([place]); History of suicide attempt; Generalized anxiety disorder; Depression with anxiety; Mood changes; BV (bacterial vaginosis); and Supervision of low-risk first pregnancy on their problem list.  ----------------------------------------------------------------------------------- Patient reports no cramping, no leaking and she has just had her anatomy scan. 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 she is getting over COVID and her voice is hoarse..   Contractions: Not present. Vag. Bleeding: None.  Movement: Present. Leaking Fluid denies.  ----------------------------------------------------------------------------------- The following portions of the patient's history were reviewed and updated as appropriate: allergies, current medications, past family history, past medical history, past social history, past surgical history and problem list. Problem list updated.  Objective  Blood pressure 100/66, weight 154 lb (69.9 kg), last menstrual period 03/22/2020. Pregravid weight 133 lb (60.3 kg) Total Weight Gain 21 lb (9.526 kg) Urinalysis: Urine Protein    Urine Glucose    Fetal Status:     Movement: Present     General:  Alert, oriented and cooperative. Patient is in no acute distress.  Skin: Skin is warm and dry. No rash noted.   Cardiovascular: Normal heart rate noted  Respiratory: Normal respiratory effort, no problems with respiration noted  Abdomen: Soft, gravid, appropriate for gestational age. Pain/Pressure: Absent     Pelvic:  Cervical exam deferred         Extremities: Normal range of motion.     Mental Status: Normal mood and affect. Normal behavior. Normal judgment and thought content.   Assessment   20 y.o. G1P0000 at [redacted]w[redacted]d by  01/02/2021, by Ultrasound presenting for routine prenatal visit  Plan   Pregnancy#1 Problems (from 03/22/20 to present)    Problem Noted Resolved   Supervision of low-risk first pregnancy 05/14/2020 by Will Bonnet, MD No   Overview Addendum 08/19/2020  4:47 PM by Imagene Riches, [place] Prenatal Labs  Dating [place] 7 weeks Blood type: O/Positive/-- (10/06 1430)   Genetic Screen NIPS: Antibody:Negative (10/06 1430)  Anatomic [place] Normal female Rubella: 1.11 (10/06 1430) Varicella: Immune  GTT  Third trimester:  RPR: Non Reactive (10/06 1430)   Rhogam n/a HBsAg: Negative (10/06 1430)   TDaP vaccine                       Flu Shot: HIV: Non Reactive (10/06 1430)   Baby Food                                GBS:   Contraception  Pap:n/a  CBB  no   CS/VBAC n/a   Support Person            Previous Version       Preterm labor symptoms and general obstetric precautions including but not limited to vaginal bleeding, contractions, leaking of fluid and fetal movement were reviewed in detail with the patient. Please refer to After Visit Summary for other counseling recommendations.  Discussed her anatomy scan. Also addressed her mood, which she reports is good. She denies any depressive sxs.  Return in  about 4 weeks (around 09/16/2020) for return OB.  Imagene Riches, CNM  08/19/2020 4:56 PM

## 2020-09-01 ENCOUNTER — Other Ambulatory Visit: Payer: Self-pay

## 2020-09-01 ENCOUNTER — Ambulatory Visit (INDEPENDENT_AMBULATORY_CARE_PROVIDER_SITE_OTHER): Payer: Medicaid Other | Admitting: Obstetrics and Gynecology

## 2020-09-01 VITALS — BP 98/52 | Wt 162.0 lb

## 2020-09-01 DIAGNOSIS — Z3A22 22 weeks gestation of pregnancy: Secondary | ICD-10-CM

## 2020-09-01 DIAGNOSIS — O368120 Decreased fetal movements, second trimester, not applicable or unspecified: Secondary | ICD-10-CM

## 2020-09-01 DIAGNOSIS — Z3401 Encounter for supervision of normal first pregnancy, first trimester: Secondary | ICD-10-CM

## 2020-09-01 NOTE — Progress Notes (Signed)
Routine Prenatal Care Visit  Subjective  Teresa Teresa [name] is a 20 y.o. G1P0000 at [redacted]w[redacted]d being seen today for ongoing prenatal care.  She is currently monitored for the following issues for this low-risk pregnancy and has AD (atopic dermatitis); Family planning; Elevated TSH; History of chlamydia infection; Screening for STD (sexually transmitted disease); History of ankle sprain; MDD (major depressive disorder), recurrent episode, moderate (Teresa Teresa [name]); History of suicide attempt; Generalized anxiety disorder; Depression with anxiety; Teresa [name] changes; BV (bacterial vaginosis); and Supervision of low-risk first pregnancy on their problem list.  ----------------------------------------------------------------------------------- Patient reports decreased fetal movement.   Contractions: Not present. Vag. Bleeding: None.  Movement: Present. Denies leaking of fluid.  ----------------------------------------------------------------------------------- The following portions of the patient's history were reviewed and updated as appropriate: allergies, current medications, past family history, past medical history, past social history, past surgical history and problem list. Problem list updated.   Objective  Blood pressure (!) 98/52, weight 162 lb (73.5 kg), last menstrual period 03/22/2020. Pregravid weight 133 lb (60.3 kg) Total Weight Gain 29 lb (13.2 kg) Urinalysis:      Fetal Status: Fetal Heart Rate (bpm): 145   Movement: Present     General:  Alert, oriented and cooperative. Patient is in no acute distress.  Skin: Skin is warm and dry. No rash noted.   Cardiovascular: Normal heart rate noted  Respiratory: Normal respiratory effort, no problems with respiration noted  Abdomen: Soft, gravid, appropriate for gestational age. Pain/Pressure: Absent     Pelvic:  Cervical exam deferred        Extremities: Normal range of motion.     ental Status: Normal Teresa [name] and affect. Normal behavior. Normal judgment and  thought content.     Assessment   20 y.o. G1P0000 at [redacted]w[redacted]d by  01/02/2021, by Ultrasound presenting for routine prenatal visit  Plan   Pregnancy#1 Problems (from 03/22/20 to present)    Problem Noted Resolved   Supervision of low-risk first pregnancy 05/14/2020 by Will Bonnet, MD No   Overview Addendum 08/19/2020  4:47 PM by Imagene Riches, [place] Prenatal Labs  Dating [place] 7 weeks Blood type: O/Positive/-- (10/06 1430)   Genetic Screen NIPS: Antibody:Negative (10/06 1430)  Anatomic [place] Normal female Rubella: 1.11 (10/06 1430) Varicella: Immune  GTT  Third trimester:  RPR: Non Reactive (10/06 1430)   Rhogam n/a HBsAg: Negative (10/06 1430)   TDaP vaccine                       Flu Shot: HIV: Non Reactive (10/06 1430)   Baby Food                                GBS:   Contraception  Pap:n/a  CBB  no   CS/VBAC n/a   Support Person            Previous Version       Gestational age appropriate obstetric precautions including but not limited to vaginal bleeding, contractions, leaking of fluid and fetal movement were reviewed in detail with the patient.    Decreased fetal movement - discussed that at this gestational age movement may not be very consistent just yet.  Normal FHT's today.  Previable to early for NST.  Return if symptoms worsen or fail to improve, otherwise keep 09/16/2020 appointment.  Teresa Mood, MD, [facility], [facility] 09/01/2020, 1:56  PM

## 2020-09-16 ENCOUNTER — Encounter: Payer: Self-pay | Admitting: Obstetrics and Gynecology

## 2020-09-16 ENCOUNTER — Other Ambulatory Visit: Payer: Self-pay

## 2020-09-16 ENCOUNTER — Ambulatory Visit (INDEPENDENT_AMBULATORY_CARE_PROVIDER_SITE_OTHER): Payer: Medicaid Other | Admitting: Obstetrics and Gynecology

## 2020-09-16 VITALS — BP 100/70 | Ht 66.0 in | Wt 165.4 lb

## 2020-09-16 DIAGNOSIS — Z3A24 24 weeks gestation of pregnancy: Secondary | ICD-10-CM

## 2020-09-16 DIAGNOSIS — Z3401 Encounter for supervision of normal first pregnancy, first trimester: Secondary | ICD-10-CM

## 2020-09-16 LAB — POCT URINALYSIS DIPSTICK OB
Glucose, UA: NEGATIVE
POC,PROTEIN,UA: NEGATIVE

## 2020-09-16 NOTE — Patient Instructions (Signed)

## 2020-09-16 NOTE — Progress Notes (Signed)
Routine Prenatal Care Visit  Subjective  Teresa [name] is a 20 y.o. G1P0000 at [redacted]w[redacted]d being seen today for ongoing prenatal care.  She is currently monitored for the following issues for this low-risk pregnancy and has AD (atopic dermatitis); Family planning; Elevated TSH; History of chlamydia infection; Screening for STD (sexually transmitted disease); History of ankle sprain; MDD (major depressive disorder), recurrent episode, moderate ([place]); History of suicide attempt; Generalized anxiety disorder; Depression with anxiety; Mood changes; BV (bacterial vaginosis); and Supervision of low-risk first pregnancy on their problem list.  ----------------------------------------------------------------------------------- Patient reports constipation, leakage of milk and some cracked nipples with bleeding.  Contractions: Not present. Vag. Bleeding: None.  Movement: Present. Denies leaking of fluid.  ----------------------------------------------------------------------------------- The following portions of the patient's history were reviewed and updated as appropriate: allergies, current medications, past family history, past medical history, past social history, past surgical history and problem list. Problem list updated.   Objective  Blood pressure 100/70, height 5\' 6"  (1.676 m), weight 165 lb 6.4 oz (75 kg), last menstrual period 03/22/2020. Pregravid weight 133 lb (60.3 kg) Total Weight Gain 32 lb 6.4 oz (14.7 kg) Urinalysis:      Fetal Status: Fetal Heart Rate (bpm): 140 Fundal Height: 24 cm Movement: Present     General:  Alert, oriented and cooperative. Patient is in no acute distress.  Skin: Skin is warm and dry. No rash noted.   Cardiovascular: Normal heart rate noted  Respiratory: Normal respiratory effort, no problems with respiration noted  Abdomen: Soft, gravid, appropriate for gestational age. Pain/Pressure: Absent     Pelvic:  Cervical exam deferred        Extremities: Normal  range of motion.  Edema: None  Mental Status: Normal mood and affect. Normal behavior. Normal judgment and thought content.  Breast visually inspected and areolas appeared normal, slight engorgement.    Assessment   20 y.o. G1P0000 at [redacted]w[redacted]d by  01/02/2021, by Ultrasound presenting for routine prenatal visit  Plan   Pregnancy#1 Problems (from 03/22/20 to present)    Problem Noted Resolved   Supervision of low-risk first pregnancy 05/14/2020 by Will Bonnet, MD No   Overview Addendum 09/16/2020  2:24 PM by Homero Fellers, MD     Nursing Staff Provider  Office Location  [facility] Dating   7wk [place]  Language  English Anatomy US  complete  Flu Vaccine   Genetic Screen  NIPS: normal xx  TDaP vaccine    Hgb A1C or  GTT Early : Third trimester :   Covid  J&J   LAB RESULTS   Rhogam  Not needed Blood Type O/Positive/-- (10/06 1430)   Feeding Plan  Breast Antibody Negative (10/06 1430)  Contraception None/condoms Rubella 1.11 (10/06 1430)  Circumcision  RPR Non Reactive (10/06 1430)   Pediatrician   HBsAg Negative (10/06 1430)   Support Person  Partner- Cesar HIV Non Reactive (10/06 1430)  Prenatal Classes  Discussed Varicella  Immune    GBS  (For PCN allergy, check sensitivities)   BTL Consent     VBAC Consent  Pap  under 21    Hgb Electro      CF      SMA                Previous Version      Declines flu shot today- may consider in future Discussed prenatal classes. Discussed OTC medications for constipation Discussed OTC management of nipple tenderness and cracked skin.   Gestational  age appropriate obstetric precautions including but not limited to vaginal bleeding, contractions, leaking of fluid and fetal movement were reviewed in detail with the patient.    Return in about 4 weeks (around 10/14/2020) for ROB and 1 GTT.  Homero Fellers MD [facility], [facility] 09/16/2020, 2:24 PM

## 2020-10-07 DIAGNOSIS — H5213 Myopia, bilateral: Secondary | ICD-10-CM | POA: Diagnosis not present

## 2020-10-10 ENCOUNTER — Ambulatory Visit (INDEPENDENT_AMBULATORY_CARE_PROVIDER_SITE_OTHER): Payer: Medicaid Other | Admitting: Obstetrics and Gynecology

## 2020-10-10 ENCOUNTER — Other Ambulatory Visit: Payer: Medicaid Other

## 2020-10-10 ENCOUNTER — Other Ambulatory Visit: Payer: Self-pay

## 2020-10-10 VITALS — BP 110/68 | Wt 174.4 lb

## 2020-10-10 DIAGNOSIS — Z3A28 28 weeks gestation of pregnancy: Secondary | ICD-10-CM

## 2020-10-10 DIAGNOSIS — Z3403 Encounter for supervision of normal first pregnancy, third trimester: Secondary | ICD-10-CM

## 2020-10-10 DIAGNOSIS — Z3401 Encounter for supervision of normal first pregnancy, first trimester: Secondary | ICD-10-CM

## 2020-10-10 LAB — POCT URINALYSIS DIPSTICK OB: POC,PROTEIN,UA: NEGATIVE

## 2020-10-10 NOTE — Progress Notes (Signed)
Routine Prenatal Care Visit  Subjective  Teresa [name] is a 20 y.o. G1P0000 at [redacted]w[redacted]d being seen today for ongoing prenatal care.  She is currently monitored for the following issues for this low-risk pregnancy and has AD (atopic dermatitis); Family planning; Elevated TSH; History of chlamydia infection; Screening for STD (sexually transmitted disease); History of ankle sprain; MDD (major depressive disorder), recurrent episode, moderate ([name]); History of suicide attempt; Generalized anxiety disorder; Depression with anxiety; [name] changes; BV (bacterial vaginosis); and Supervision of low-risk first pregnancy on their problem list.  ----------------------------------------------------------------------------------- Patient reports no complaints.   Contractions: Not present. Vag. Bleeding: None.  Movement: Present. Denies leaking of fluid.  ----------------------------------------------------------------------------------- The following portions of the patient's history were reviewed and updated as appropriate: allergies, current medications, past family history, past medical history, past social history, past surgical history and problem list. Problem list updated.   Objective  Blood pressure 110/68, weight 174 lb 6 oz (79.1 kg), last menstrual period 03/22/2020. Pregravid weight 133 lb (60.3 kg) Total Weight Gain 41 lb 6 oz (18.8 kg) Urinalysis:      Fetal Status: Fetal Heart Rate (bpm): 150 Fundal Height: 29 cm Movement: Present  Presentation: Vertex  General:  Alert, oriented and cooperative. Patient is in no acute distress.  Skin: Skin is warm and dry. No rash noted.   Cardiovascular: Normal heart rate noted  Respiratory: Normal respiratory effort, no problems with respiration noted  Abdomen: Soft, gravid, appropriate for gestational age. Pain/Pressure: Absent     Pelvic:  Cervical exam deferred        Extremities: Normal range of motion.     ental Status: Normal [name] and affect.  Normal behavior. Normal judgment and thought content.     Assessment   20 y.o. G1P0000 at [redacted]w[redacted]d by  01/02/2021, by Ultrasound presenting for routine prenatal visit  Plan   Pregnancy#1 Problems (from 03/22/20 to present)    Problem Noted Resolved   Supervision of low-risk first pregnancy 05/14/2020 by Will Bonnet, MD No   Overview Addendum 09/16/2020  2:24 PM by Homero Fellers, MD     Nursing Staff Provider  Office Location  [facility] Dating   7wk [place]  Language  English Anatomy US  complete  Flu Vaccine   Genetic Screen  NIPS: normal xx  TDaP vaccine    Hgb A1C or  GTT Early : Third trimester :   Covid  J&J   LAB RESULTS   Rhogam  Not needed Blood Type O/Positive/-- (10/06 1430)   Feeding Plan  Breast Antibody Negative (10/06 1430)  Contraception None/condoms Rubella 1.11 (10/06 1430)  Circumcision  RPR Non Reactive (10/06 1430)   Pediatrician   HBsAg Negative (10/06 1430)   Support Person  Partner- Teresa [name] HIV Non Reactive (10/06 1430)  Prenatal Classes  Discussed Varicella  Immune    GBS  (For PCN allergy, check sensitivities)   BTL Consent     VBAC Consent  Pap  under 21    Hgb Electro      CF      SMA                Previous Version       Gestational age appropriate obstetric precautions including but not limited to vaginal bleeding, contractions, leaking of fluid and fetal movement were reviewed in detail with the patient.    - 28 week labs   Return in about 2 weeks (around 10/24/2020) for ROB.  Teresa Mood, MD,  [place], [facility] 10/10/2020, 3:48 PM

## 2020-10-11 LAB — 28 WEEK RH+PANEL
Basophils Absolute: 0 10*3/uL (ref 0.0–0.2)
Basos: 0 %
EOS (ABSOLUTE): 0.1 10*3/uL (ref 0.0–0.4)
Eos: 1 %
Gestational Diabetes Screen: 113 mg/dL (ref 65–139)
HIV Screen 4th Generation wRfx: NONREACTIVE
Hematocrit: 34.6 % (ref 34.0–46.6)
Hemoglobin: 12.4 g/dL (ref 11.1–15.9)
Immature Grans (Abs): 0.1 10*3/uL (ref 0.0–0.1)
Immature Granulocytes: 1 %
Lymphocytes Absolute: 1.3 10*3/uL (ref 0.7–3.1)
Lymphs: 14 %
MCH: 33.6 pg — ABNORMAL HIGH (ref 26.6–33.0)
MCHC: 35.8 g/dL — ABNORMAL HIGH (ref 31.5–35.7)
MCV: 94 fL (ref 79–97)
Monocytes Absolute: 0.5 10*3/uL (ref 0.1–0.9)
Monocytes: 5 %
Neutrophils Absolute: 7.2 10*3/uL — ABNORMAL HIGH (ref 1.4–7.0)
Neutrophils: 79 %
Platelets: 195 10*3/uL (ref 150–450)
RBC: 3.69 x10E6/uL — ABNORMAL LOW (ref 3.77–5.28)
RDW: 11.5 % — ABNORMAL LOW (ref 11.7–15.4)
RPR Ser Ql: NONREACTIVE
WBC: 9.1 10*3/uL (ref 3.4–10.8)

## 2020-10-16 ENCOUNTER — Encounter: Payer: Self-pay | Admitting: Obstetrics and Gynecology

## 2020-10-16 ENCOUNTER — Observation Stay: Payer: Medicaid Other

## 2020-10-16 ENCOUNTER — Observation Stay
Admission: EM | Admit: 2020-10-16 | Discharge: 2020-10-16 | Disposition: A | Discharge status: Home / Self Care | Payer: Medicaid Other | Attending: Obstetrics and Gynecology | Admitting: Obstetrics and Gynecology

## 2020-10-16 ENCOUNTER — Other Ambulatory Visit: Payer: Self-pay

## 2020-10-16 DIAGNOSIS — Z3A28 28 weeks gestation of pregnancy: Secondary | ICD-10-CM | POA: Diagnosis not present

## 2020-10-16 DIAGNOSIS — O26813 Pregnancy related exhaustion and fatigue, third trimester: Secondary | ICD-10-CM | POA: Diagnosis not present

## 2020-10-16 DIAGNOSIS — R109 Unspecified abdominal pain: Secondary | ICD-10-CM | POA: Diagnosis not present

## 2020-10-16 DIAGNOSIS — O99891 Other specified diseases and conditions complicating pregnancy: Secondary | ICD-10-CM | POA: Diagnosis not present

## 2020-10-16 DIAGNOSIS — O26893 Other specified pregnancy related conditions, third trimester: Secondary | ICD-10-CM | POA: Diagnosis present

## 2020-10-16 DIAGNOSIS — R1011 Right upper quadrant pain: Secondary | ICD-10-CM

## 2020-10-16 LAB — CBC
HCT: 33.7 % — ABNORMAL LOW (ref 36.0–46.0)
Hemoglobin: 11.7 g/dL — ABNORMAL LOW (ref 12.0–15.0)
MCH: 32.8 pg (ref 26.0–34.0)
MCHC: 34.7 g/dL (ref 30.0–36.0)
MCV: 94.4 fL (ref 80.0–100.0)
Platelets: 187 10*3/uL (ref 150–400)
RBC: 3.57 MIL/uL — ABNORMAL LOW (ref 3.87–5.11)
RDW: 11.9 % (ref 11.5–15.5)
WBC: 13 10*3/uL — ABNORMAL HIGH (ref 4.0–10.5)
nRBC: 0 % (ref 0.0–0.2)

## 2020-10-16 LAB — COMPREHENSIVE METABOLIC PANEL
ALT: 14 U/L (ref 0–44)
AST: 15 U/L (ref 15–41)
Albumin: 3.4 g/dL — ABNORMAL LOW (ref 3.5–5.0)
Alkaline Phosphatase: 68 U/L (ref 38–126)
Anion gap: 7 (ref 5–15)
BUN: 10 mg/dL (ref 6–20)
CO2: 23 mmol/L (ref 22–32)
Calcium: 8.7 mg/dL — ABNORMAL LOW (ref 8.9–10.3)
Chloride: 105 mmol/L (ref 98–111)
Creatinine, Ser: 0.43 mg/dL — ABNORMAL LOW (ref 0.44–1.00)
GFR, Estimated: >60 mL/min (ref >60)
Glucose, Bld: 84 mg/dL (ref 70–99)
Potassium: 3.6 mmol/L (ref 3.5–5.1)
Sodium: 135 mmol/L (ref 135–145)
Total Bilirubin: 0.6 mg/dL (ref 0.3–1.2)
Total Protein: 6.6 g/dL (ref 6.5–8.1)

## 2020-10-16 LAB — TYPE AND SCREEN
ABO/RH(D): O POS
Antibody Screen: NEGATIVE

## 2020-10-16 LAB — ABO/RH: ABO/RH(D): O POS

## 2020-10-16 MED ORDER — SUCRALFATE 1 G PO TABS: 1.0000 g | ORAL_TABLET | Freq: Three times a day (TID) | ORAL | 1 refills | Status: DC

## 2020-10-16 MED ORDER — POLYETHYLENE GLYCOL 3350 17 G PO PACK: 17.0000 g | PACK | Freq: Every day | ORAL | 0 refills | Status: DC | PRN

## 2020-10-16 NOTE — OB Triage Note (Signed)
Notified provider of patient arrival. Gilman Schmidt, MD stated she will be by to see patient. Will notify patient of plan of care.

## 2020-10-16 NOTE — Progress Notes (Signed)
Patient arrived to unit with complaints of abdominal pain and nausea. Patient G1P0 [redacted]w[redacted]d. Patient reports no vaginal bleeding or ROM. Patient reports active fetal movement. Patient has Caesar FOB at bedside. Patient placed on EFM and TOCO to non tender area of abdomen. Will continue to review medical history. Will notify provider of patient's arrival.

## 2020-10-16 NOTE — OB Triage Note (Signed)
Patient transported to [facility] department for abdominal ultrasound. Awaiting call back for patient pick-up/ return to unit.

## 2020-10-16 NOTE — Discharge Instructions (Signed)
Chronic Constipation Chronic constipation is a condition in which a person has three or fewer bowel movements a week, for 3 months or longer. This condition is especially common in older adults. What are the causes? Causes of chronic constipation may include:  Not drinking enough fluid, eating enough food or fiber, or getting enough physical activity.  Pregnancy.  A tear in the anus (anal fissure).  Blockage in the bowel (bowel obstruction).  Narrowing of the bowel (bowel stricture).  Having a long-term medical condition, such as: ? Diabetes, hypothyroidism, or iron-deficiency anemia. ? Stroke or spinal cord injury. ? Multiple sclerosis or Parkinson's disease. ? Colon cancer. ? Dementia. ? Inflammatory bowel disease (IBD), outward collapse of the rectum (rectal prolapse), or hemorrhoids.  Taking certain medicines, including: ? Narcotics. These are a certain type of prescription pain medicine. ? Antacids or iron supplements. ? Water pills (diuretics). ? Certain blood pressure medicines. ? Anti-seizure medicines. ? Antidepressants. ? Medicines for Parkinson's disease. Other causes of this condition may include:  Stress.  Problems in the nerves and muscles that control the movement of stool.  Weak or impaired pelvic floor muscles.   What increases the risk? You may be at higher risk for chronic constipation if:  You are older than age 87.  You are female.  You live in a long-term care facility.  You have a long-term disease.  You have a mental health disorder or eating disorder. What are the signs or symptoms? The main symptom of chronic constipation is having three or fewer bowel movements a week for several weeks. Other signs and symptoms may vary from person to person. These include:  Pushing hard (straining) to pass stool, or having hard or lumpy stools.  Painful bowel movements.  Having lower abdominal discomfort, such as cramps or bloating.  Being unable  to have a bowel movement when you feel the urge, or feeling like you still need to pass stool after a bowel movement.  Feeling that you have something in your rectum that is blocking or preventing bowel movements.  Seeing blood on the toilet paper or in your stool.  Worsening confusion (in older adults). How is this diagnosed? This condition may be diagnosed based on:  Your symptoms and medical history. You will be asked about your symptoms, lifestyle, diet, and any medicines that you are taking.  A physical exam. ? Your abdomen will be examined. ? A digital rectal exam may be done. For this exam, a health care provider places a lubricated, gloved finger into the rectum.  Tests to check for any underlying causes of your constipation. These may be ordered if you have bleeding in your rectum, weight loss, or a family history of colon cancer. In these cases, you may have: ? Imaging studies of the colon. These may include X-ray, ultrasound, or a CT scan. ? Blood tests. ? A procedure to examine the inside of your colon (colonoscopy). ? More specialized tests to check:  Whether your anal sphincter works well. This is a ring-shaped muscle that controls the closing of the anus.  How well food moves through your colon. ? Tests to measure the nerve signal in your pelvic floor muscles (electromyography). How is this treated? Treatment for chronic constipation depends on the cause. Most often, treatment starts with:  Being more active and getting regular exercise.  Drinking more fluids.  Adding fiber to your diet. Sources of fiber include fruits, vegetables, whole grains, and fiber supplements.  Using medicines such as stool softeners  or medicines that increase contractions in your digestive system (pro-motility agents).  Training your pelvic muscles with biofeedback.  Surgery, if there is obstruction. Treatment may also include:  Stopping or changing some medicines if they cause  constipation.  Using a fiber supplement (bulk laxative) or stool softener.  Using a prescription laxative. This works by [employer] into your colon (osmotic laxative). You may also need to see a specialist who treats conditions of the digestive system (gastroenterologist).   Follow these instructions at home: Medicines  Take over-the-counter and prescription medicines only as told by your health care provider.  If you are taking a laxative, take it as told by your health care provider. Eating and drinking  Eat a balanced diet that includes enough fiber. Ask your health care provider to recommend a diet that is right for you.  Drink clear fluids, especially water. Avoid drinking alcohol, caffeine, and soda. These can make constipation worse.  Drink enough fluid to keep your urine pale yellow.   General instructions  Get some physical activity every day. Ask your health care provider what activities are safe for you.  Get colon cancer screenings as told by your health care provider.  Keep all follow-up visits as told by your health care provider. This is important. Contact a health care provider if you have:  Three or fewer bowel movements a week.  Stools that are hard or lumpy.  Blood on the toilet paper or in your stool after you have a bowel movement.  Unexplained weight loss.  Rectum (rectal) pain.  Stool leakage.  Nausea or vomiting. Get help right away if you have:  Rectal bleeding or you pass blood clots.  Severe rectal pain.  Body tissue that pushes out (protrudes) from your anus.  Severe pain or bloating (distension) in your abdomen.  Vomiting that you cannot control. Summary  Chronic constipation is a condition in which a person has three or fewer bowel movements a week, for 3 months or longer.  You may have a higher risk for this condition if you are an older adult, you are female, or you have a long-term disease.  Treatment for this condition  depends on the cause. Most treatments for chronic constipation include adding fiber to your diet, drinking more fluids, and getting more physical activity. You may also need to treat any underlying medical conditions or stop or change certain medicines if they cause constipation.  If lifestyle changes do not relieve constipation, your health care provider may recommend taking a laxative. This information is not intended to replace advice given to you by your health care provider. Make sure you discuss any questions you have with your health care provider. Document Revised: 06/13/2019 Document Reviewed: 06/13/2019 Elsevier Patient Education  2021 [place]. Heartburn During Pregnancy Heartburn is a type of pain or discomfort that can happen in the throat or chest. It is often described as a burning sensation. Heartburn is common during pregnancy because:  Progesterone, a hormone that is released during pregnancy, may relax the valve that separates the esophagus from the stomach (lower esophageal sphincter, or LES). This allows stomach acid to move up into the esophagus, causing heartburn.  The uterus gets larger and pushes up on the stomach, which pushes more acid into the esophagus. This is especially true in the later stages of pregnancy. Heartburn usually goes away or gets better after giving birth. What are the causes? This condition is caused by stomach acid backing up into the esophagus (reflux).  Reflux can be triggered by:  Changing hormone levels.  Large meals.  Certain foods and beverages, such as coffee, chocolate, onions, and peppermint.  Exercise.  Increased stomach acid production. What increases the risk? You are more likely to develop this condition if:  You had heartburn prior to becoming pregnant.  You have been pregnant more than once before.  You are overweight or obese. The likelihood that you will get heartburn also increases as you get further along in your  pregnancy, especially during the last trimester. What are the signs or symptoms? Symptoms of this condition include:  Burning pain in the chest or lower throat.  A bitter taste in the mouth.  Coughing.  Problems swallowing.  Vomiting.  A hoarse voice.  Asthma. Symptoms may get worse when you lie down or bend over. Symptoms are often worse at night. How is this diagnosed? This condition is diagnosed based on:  Your medical history.  Your symptoms.  Blood tests to check for a certain type of bacteria that is associated with heartburn.  Whether taking heartburn medicine relieves your symptoms.  An examination of the stomach and esophagus using a tube that has a light and camera (endoscopy). How is this treated? Treatment for this condition depends on how severe your symptoms are. Your health care provider may recommend:  Over-the-counter medicines for mild heartburn, such as antacids or acid reducers.  Prescription medicines to decrease stomach acid or to protect your stomach lining.  Certain changes in your diet.  Raising the head of your bed so it is higher than the foot of the bed. This helps prevent stomach acid from backing up into the esophagus when you are lying down. Follow these instructions at home: Eating and drinking  Do not drink alcohol during your pregnancy.  Identify foods and beverages that make your symptoms worse and avoid them.  Eat small, frequent meals instead of large meals.  Avoid drinking large amounts of liquid with your meals.  Avoid eating meals during the 2-3 hours before bedtime.  Avoid lying down right after you eat.  Do not exercise right after you eat. Beverages to avoid  Coffee and tea, with or without caffeine.  Energy drinks and sports drinks.  Carbonated drinks or sodas.  Citrus fruit juices. Foods to avoid  Chocolate and cocoa.  Peppermint and mint flavorings.  Garlic, onions, and horseradish.  Spicy and  acidic foods, including peppers, chili powder, curry powder, vinegar, hot sauces, and barbecue sauce.  Citrus fruits, such as oranges, lemons, and limes.  Tomato-based foods, such as red sauce, chili, and salsa.  Fried and fatty foods, such as donuts, french fries, potato chips, and high-fat dressings.  High-fat meats, such as hot dogs, precooked or cured meat, sausage, ham, and bacon.  High-fat dairy items, such as whole milk, butter, and cheese. Medicines  Take over-the-counter and prescription medicines only as told by your health care provider.  Do not take aspirin or NSAIDs, such as ibuprofen, unless your health care provider tells you to take them.  You may be instructed to avoid medicines that contain sodium bicarbonate. General instructions  If directed, raise the head of your bed about 6 inches (15 cm) by putting blocks under the legs. Sleeping with more pillows does not effectively relieve heartburn because it only changes the position of your head.  Do not use any products that contain nicotine or tobacco, such as cigarettes, e-cigarettes, and chewing tobacco. If you need help quitting, ask your health care  provider.  Wear loose-fitting clothing.  Try to reduce your stress, such as with yoga or meditation. If you need help managing stress, ask your health care provider.  Maintain a healthy weight. If you are overweight, work with your health care provider to safely manage your weight.  Keep all follow-up visits as told by your health care provider. This is important. Where to find more information  American Pregnancy Association: americanpregnancy.org Contact a health care provider if:  Your symptoms do not improve with treatment, or you develop new symptoms.  You have unexplained weight loss.  You have difficulty swallowing.  You make loud sounds when you breathe (wheeze).  You have a cough that does not go away.  You have frequent heartburn for more than 2  weeks.  You have nausea or vomiting that does not get better with treatment.  You have pain in your abdomen. Get help right away if:  You have severe chest pain that spreads to your arm, neck, or jaw.  You feel sweaty, dizzy, or light-headed.  You have shortness of breath.  You have pain when swallowing.  You vomit, and your vomit looks like blood or coffee grounds.  Your stool is bloody or black. Summary  Heartburn in pregnancy is common, especially during the last trimester.  This condition is caused by stomach acid backing up into the esophagus (reflux).  This condition can be treated with medicines, changes to your diet, or elevating the head of your bed.  Keep all follow-up visits as told by your health care provider. This is important. This information is not intended to replace advice given to you by your health care provider. Make sure you discuss any questions you have with your health care provider. Document Revised: 04/18/2019 Document Reviewed: 04/18/2019 Elsevier Patient Education  [place].

## 2020-10-16 NOTE — Discharge Summary (Signed)
Physician Discharge Summary  Patient ID: Teresa [name] MRN: [id number] DOB/AGE: Jun 12, 2001 20 y.o.  Admit date: 10/16/2020 Discharge date: 10/16/2020  Admission Diagnoses:  Discharge Diagnoses:  Active Problems:   Abdominal pain in pregnancy, third trimester   Discharged Condition: good  Hospital Course: see note  Consults: None  Significant Diagnostic Studies: [place]  Treatments: none  Discharge Exam: Blood pressure 103/60, pulse 89, temperature 98.2 F (36.8 C), temperature source Oral, last menstrual period 03/22/2020. General appearance: alert, cooperative and appears stated age GI: soft, non-tender; bowel sounds normal; no masses,  no organomegaly Skin: Skin color, texture, turgor normal. No rashes or lesions Neurologic: Alert and oriented X 3, normal strength and tone. Normal symmetric reflexes. Normal coordination and gait  Disposition: Discharge disposition: 01-Home or Self Care        Allergies as of 10/16/2020   No Known Allergies     Medication List    TAKE these medications   polyethylene glycol 17 g packet Commonly known as: MiraLax Take 17 g by mouth daily as needed for moderate constipation.   Prenatal Vitamins 28-0.8 MG Tabs Take 1 tablet by mouth daily.   sucralfate 1 g tablet Commonly known as: Carafate Take 1 tablet (1 g total) by mouth 4 (four) times daily -  with meals and at bedtime.        Signed: Homero Fellers 10/16/2020, 11:58 PM

## 2020-10-16 NOTE — Progress Notes (Signed)
Teresa [name] is an 20 y.o. female.   Chief Complaint: Abdominal pain in pregnancy HPI: Teresa [name] reports 2 weeks of right upper quadrant pain. She reports the pain has been constant throughout the day and feels like a mild ache to a sharp pain. Tonight she was not able to eat dinner because of the pain. Tonight she became nauseous. She has not had vomitingThe pain is usually worse with eating.  She is not having itching of her abdomen, palms, or feet. She has been having some constipation.   She is feeling normal fetal movement. No constipation. No vaginal bleeding   Past Medical History:  Diagnosis Date  . Elevated TSH   . History of chicken pox   . Mass of left ovary   . Moderate depressive episode [facility])     Past Surgical History:  Procedure Laterality Date  . DENTAL SURGERY  2019   Dental implant    Family History  Problem Relation Age of Onset  . Obesity Mother    Social History:  reports that she has quit smoking. Her smoking use included cigarettes. She has never used smokeless tobacco. She reports current drug use. Frequency: 7.00 times per week. Drug: Marijuana. She reports that she does not drink alcohol.  Allergies: No Known Allergies  Medications Prior to Admission  Medication Sig Dispense Refill  . Prenatal Vit-Fe Fumarate-FA (PRENATAL VITAMINS) 28-0.8 MG TABS Take 1 tablet by mouth daily.      Results for orders placed or performed during the hospital encounter of 10/16/20 (from the past 48 hour(s))  CBC     Status: Abnormal   Collection Time: 10/16/20  9:52 PM  Result Value Ref Range   WBC 13.0 (H) 4.0 - 10.5 K/uL   RBC 3.57 (L) 3.87 - 5.11 MIL/uL   Hemoglobin 11.7 (L) 12.0 - 15.0 g/dL   HCT 33.7 (L) 36.0 - 46.0 %   MCV 94.4 80.0 - 100.0 fL   MCH 32.8 26.0 - 34.0 pg   MCHC 34.7 30.0 - 36.0 g/dL   RDW 11.9 11.5 - 15.5 %   Platelets 187 150 - 400 K/uL   nRBC 0.0 0.0 - 0.2 %    Comment: Performed at [facility], [address] [place], [place]  [address]  Type and screen [place]     Status: None   Collection Time: 10/16/20  9:52 PM  Result Value Ref Range   ABO/RH(D) O POS    Antibody Screen NEG    Sample Expiration      10/19/2020,2359 Performed at [facility], [address] [place], [place] [address]   Comprehensive metabolic panel     Status: Abnormal   Collection Time: 10/16/20  9:52 PM  Result Value Ref Range   Sodium 135 135 - 145 mmol/L   Potassium 3.6 3.5 - 5.1 mmol/L   Chloride 105 98 - 111 mmol/L   CO2 23 22 - 32 mmol/L   Glucose, Bld 84 70 - 99 mg/dL    Comment: Glucose reference range applies only to samples taken after fasting for at least 8 hours.   BUN 10 6 - 20 mg/dL   Creatinine, Ser 0.43 (L) 0.44 - 1.00 mg/dL   Calcium 8.7 (L) 8.9 - 10.3 mg/dL   Total Protein 6.6 6.5 - 8.1 g/dL   Albumin 3.4 (L) 3.5 - 5.0 g/dL   AST 15 15 - 41 U/L   ALT 14 0 - 44 U/L   Alkaline  Phosphatase 68 38 - 126 U/L   Total Bilirubin 0.6 0.3 - 1.2 mg/dL   GFR, Estimated >60 >60 mL/min    Comment: (NOTE) Calculated using the CKD-EPI Creatinine Equation (2021)    Anion gap 7 5 - 15    Comment: Performed at [facility], [address] [place], [place] [address]  ABO/Rh     Status: None   Collection Time: 10/16/20 11:15 PM  Result Value Ref Range   ABO/RH(D)      O POS Performed at [facility], [address] [place], [place] [address]    US Abdomen Limited RUQ (LIVER/GB)  Result Date: 10/16/2020 CLINICAL DATA:  Pregnant, right upper quadrant abdominal pain EXAM: ULTRASOUND ABDOMEN LIMITED RIGHT UPPER QUADRANT COMPARISON:  None. FINDINGS: Gallbladder: No gallstones or wall thickening visualized. No sonographic Murphy sign noted by sonographer. Common bile duct: Diameter: 3-4 mm in proximal diameter Liver: No focal lesion identified. Within normal limits in parenchymal echogenicity. Portal vein is patent on color Doppler imaging with normal direction of blood flow  towards the liver. Other: None. IMPRESSION: Normal examination Electronically Signed   By: Fidela Salisbury MD   On: 10/16/2020 23:00    Review of Systems  Constitutional: Negative for chills and fever.  HENT: Negative for congestion, hearing loss and sinus pain.   Respiratory: Negative for cough, shortness of breath and wheezing.   Cardiovascular: Negative for chest pain, palpitations and leg swelling.  Gastrointestinal: Positive for abdominal pain, constipation and nausea. Negative for diarrhea and vomiting.  Genitourinary: Negative for dysuria, flank pain, frequency, hematuria and urgency.  Musculoskeletal: Negative for back pain.  Skin: Negative for rash.  Neurological: Negative for dizziness and headaches.  Psychiatric/Behavioral: Negative for suicidal ideas. The patient is not nervous/anxious.     Blood pressure 103/60, pulse 89, temperature 98.2 F (36.8 C), temperature source Oral, last menstrual period 03/22/2020. Physical Exam Vitals and nursing note reviewed.  Constitutional:      Appearance: She is well-developed.  HENT:     Head: Normocephalic and atraumatic.  Cardiovascular:     Rate and Rhythm: Normal rate and regular rhythm.  Pulmonary:     Effort: Pulmonary effort is normal.     Breath sounds: Normal breath sounds.  Abdominal:     General: Bowel sounds are normal.     Palpations: Abdomen is soft.     Tenderness: There is abdominal tenderness in the right upper quadrant and epigastric area. There is no guarding or rebound.     Hernia: No hernia is present.  Genitourinary:    Uterus: Normal.   Musculoskeletal:        General: Normal range of motion.  Skin:    General: Skin is warm and dry.  Neurological:     Mental Status: She is alert and oriented to person, place, and time.  Psychiatric:        Behavior: Behavior normal.        Thought Content: Thought content normal.        Judgment: Judgment normal.     NST: 150 bpm baseline, moderate variability,  10x10 accelerations, no decelerations. Tocometer: no contractions   Assessment/Plan 20 yo G1P0000 [redacted]w[redacted]d with RUQ pain and difficulty eating Will obtain RUQ ultrasound CMP, CBC, type and screen  Update: RUQ [place] was within normal limits. Gallbladder normal, no obvious cause of abdominal pain.  Patient does report some symptoms of reflux.  Recommended trailing Carafate with Miralax for constipation for 1 week. Reviewed bland food  diet.  Close follow up planned with Joycelyn Schmid next week in clinic.  Patient discharged home in stable condition.   Adrian Prows MD, [facility], [facility] 10/16/2020 11:55 PM

## 2020-10-16 NOTE — OB Triage Note (Signed)
Patient transported back to room awaiting results of u/s.

## 2020-10-17 ENCOUNTER — Telehealth: Payer: Self-pay

## 2020-10-17 NOTE — Telephone Encounter (Signed)
Transition Care Management Follow-up Telephone Call  Date of discharge and from where: 10/16/20 from [facility]  How have you been since you were released from the hospital? Pt stated that she is feeling better. Pt stated that she is going to the store this morning to pick up the miralax that is recommended.   Any questions or concerns? No  Items Reviewed:  Did the pt receive and understand the discharge instructions provided? Yes   Medications obtained and verified? Yes   Other? No   Any new allergies since your discharge? No   Dietary orders reviewed? n/a  Do you have support at home? Yes     Functional Questionnaire: (I = Independent and D = Dependent) ADLs: I  Bathing/Dressing- I  Meal Prep- I  Eating- I  Maintaining continence- I  Transferring/Ambulation- I  Managing Meds- I   Follow up appointments reviewed:   PCP Hospital f/u appt confirmed? No    Specialist Hospital f/u appt confirmed? Yes  Scheduled to see Gigi Gin, CNM on 10/24/2020 @ 10:50am.  Are transportation arrangements needed? No   If their condition worsens, is the pt aware to call PCP or go to the Emergency Dept.? Yes  Was the patient provided with contact information for the PCP's office or ED? Yes  Was to pt encouraged to call back with questions or concerns? Yes

## 2020-10-24 ENCOUNTER — Encounter: Payer: Medicaid Other | Admitting: Obstetrics

## 2020-10-27 ENCOUNTER — Encounter: Payer: Medicaid Other | Admitting: Obstetrics

## 2020-10-28 ENCOUNTER — Other Ambulatory Visit: Payer: Self-pay

## 2020-10-28 ENCOUNTER — Encounter: Payer: Self-pay | Admitting: Advanced Practice Midwife

## 2020-10-28 ENCOUNTER — Ambulatory Visit (INDEPENDENT_AMBULATORY_CARE_PROVIDER_SITE_OTHER): Payer: Medicaid Other | Admitting: Advanced Practice Midwife

## 2020-10-28 VITALS — BP 118/74 | Wt 176.0 lb

## 2020-10-28 DIAGNOSIS — Z3403 Encounter for supervision of normal first pregnancy, third trimester: Secondary | ICD-10-CM

## 2020-10-28 DIAGNOSIS — Z3A30 30 weeks gestation of pregnancy: Secondary | ICD-10-CM

## 2020-10-28 NOTE — Progress Notes (Signed)
Routine Prenatal Care Visit  Subjective  Teresa [name] is a 20 y.o. G1P0000 at [redacted]w[redacted]d being seen today for ongoing prenatal care.  She is currently monitored for the following issues for this low-risk pregnancy and has AD (atopic dermatitis); Family planning; Elevated TSH; History of chlamydia infection; Screening for STD (sexually transmitted disease); History of ankle sprain; MDD (major depressive disorder), recurrent episode, moderate ([place]); History of suicide attempt; Generalized anxiety disorder; Depression with anxiety; Mood changes; BV (bacterial vaginosis); Supervision of low-risk first pregnancy; and Abdominal pain in pregnancy, third trimester on their problem list.  ----------------------------------------------------------------------------------- Patient reports no complaints.   Contractions: Not present. Vag. Bleeding: None.  Movement: Present. Leaking Fluid denies.  ----------------------------------------------------------------------------------- The following portions of the patient's history were reviewed and updated as appropriate: allergies, current medications, past family history, past medical history, past social history, past surgical history and problem list. Problem list updated.  Objective  Blood pressure 118/74, weight 176 lb (79.8 kg), last menstrual period 03/22/2020. Pregravid weight 133 lb (60.3 kg) Total Weight Gain 43 lb (19.5 kg) Urinalysis: Urine Protein    Urine Glucose    Fetal Status: Fetal Heart Rate (bpm): 154 Fundal Height: 31 cm Movement: Present     General:  Alert, oriented and cooperative. Patient is in no acute distress.  Skin: Skin is warm and dry. No rash noted.   Cardiovascular: Normal heart rate noted  Respiratory: Normal respiratory effort, no problems with respiration noted  Abdomen: Soft, gravid, appropriate for gestational age. Pain/Pressure: Absent     Pelvic:  Cervical exam deferred        Extremities: Normal range of motion.  Edema:  None  Mental Status: Normal mood and affect. Normal behavior. Normal judgment and thought content.   Assessment   20 y.o. G1P0000 at [redacted]w[redacted]d by  01/02/2021, by Ultrasound presenting for routine prenatal visit  Plan   Pregnancy#1 Problems (from 03/22/20 to present)    Problem Noted Resolved   Supervision of low-risk first pregnancy 05/14/2020 by Will Bonnet, MD No   Overview Addendum 09/16/2020  2:24 PM by Homero Fellers, MD     Nursing Staff Provider  Office Location  [facility] Dating   7wk [place]  Language  English Anatomy US  complete  Flu Vaccine   Genetic Screen  NIPS: normal xx  TDaP vaccine    Hgb A1C or  GTT Early : Third trimester :   Covid  J&J   LAB RESULTS   Rhogam  Not needed Blood Type O/Positive/-- (10/06 1430)   Feeding Plan  Breast Antibody Negative (10/06 1430)  Contraception None/condoms Rubella 1.11 (10/06 1430)  Circumcision  RPR Non Reactive (10/06 1430)   Pediatrician   HBsAg Negative (10/06 1430)   Support Person  Partner- Cesar HIV Non Reactive (10/06 1430)  Prenatal Classes  Discussed Varicella  Immune    GBS  (For PCN allergy, check sensitivities)   BTL Consent     VBAC Consent  Pap  under 21    Hgb Electro      CF      SMA                Previous Version       Preterm labor symptoms and general obstetric precautions including but not limited to vaginal bleeding, contractions, leaking of fluid and fetal movement were reviewed in detail with the patient.   Return in about 2 weeks (around 11/11/2020) for rob.  Rod Can, CNM 10/28/2020 3:00 PM

## 2020-10-28 NOTE — Progress Notes (Signed)
No vb. No lof.  

## 2020-11-11 ENCOUNTER — Encounter: Payer: Medicaid Other | Admitting: Obstetrics

## 2020-11-14 ENCOUNTER — Other Ambulatory Visit: Payer: Self-pay

## 2020-11-14 ENCOUNTER — Ambulatory Visit (INDEPENDENT_AMBULATORY_CARE_PROVIDER_SITE_OTHER): Payer: Medicaid Other | Admitting: Obstetrics

## 2020-11-14 VITALS — BP 110/70 | Wt 181.0 lb

## 2020-11-14 DIAGNOSIS — Z3A33 33 weeks gestation of pregnancy: Secondary | ICD-10-CM

## 2020-11-14 DIAGNOSIS — O0990 Supervision of high risk pregnancy, unspecified, unspecified trimester: Secondary | ICD-10-CM

## 2020-11-14 LAB — POCT URINALYSIS DIPSTICK OB
Glucose, UA: NEGATIVE
POC,PROTEIN,UA: NEGATIVE

## 2020-11-14 NOTE — Progress Notes (Signed)
Routine Prenatal Care Visit  Subjective  Teresa [name] is a 20 y.o. G1P0000 at [redacted]w[redacted]d being seen today for ongoing prenatal care.  She is currently monitored for the following issues for this high-risk pregnancy and has AD (atopic dermatitis); Family planning; Elevated TSH; History of chlamydia infection; Screening for STD (sexually transmitted disease); History of ankle sprain; MDD (major depressive disorder), recurrent episode, moderate ([place]); History of suicide attempt; Generalized anxiety disorder; Depression with anxiety; Mood changes; BV (bacterial vaginosis); Supervision of low-risk first pregnancy; and Abdominal pain in pregnancy, third trimester on their problem list.  ----------------------------------------------------------------------------------- Patient reports no bleeding, no contractions, no cramping, no leaking and and she would like the freedom to give birth in a poisition of her choosing, not necessarily in the birthing bed..    .  .   [name] Fluid denies.  ----------------------------------------------------------------------------------- The following portions of the patient's history were reviewed and updated as appropriate: allergies, current medications, past family history, past medical history, past social history, past surgical history and problem list. Problem list updated.  Objective  Blood pressure 110/70, weight 181 lb (82.1 kg), last menstrual period 03/22/2020. Pregravid weight 133 lb (60.3 kg) Total Weight Gain 48 lb (21.8 kg) Urinalysis: Urine Protein    Urine Glucose    Fetal Status:           General:  Alert, oriented and cooperative. Patient is in no acute distress.  Skin: Skin is warm and dry. No rash noted.   Cardiovascular: Normal heart rate noted  Respiratory: Normal respiratory effort, no problems with respiration noted  Abdomen: Soft, gravid, appropriate for gestational age.       Pelvic:  Cervical exam deferred        Extremities: Normal range  of motion.     Mental Status: Normal mood and affect. Normal behavior. Normal judgment and thought content.   Assessment   20 y.o. G1P0000 at [redacted]w[redacted]d by  01/02/2021, by Ultrasound presenting for routine prenatal visit  Plan   Pregnancy#1 Problems (from 03/22/20 to present)    Problem Noted Resolved   Supervision of low-risk first pregnancy 05/14/2020 by Will Bonnet, MD No   Overview Addendum 09/16/2020  2:24 PM by Homero Fellers, MD     Nursing Staff Provider  Office Location  [facility] Dating   7wk [place]  Language  English Anatomy US  complete  Flu Vaccine   Genetic Screen  NIPS: normal xx  TDaP vaccine    Hgb A1C or  GTT Early : Third trimester :   Covid  J&J   LAB RESULTS   Rhogam  Not needed Blood Type O/Positive/-- (10/06 1430)   Feeding Plan  Breast Antibody Negative (10/06 1430)  Contraception None/condoms Rubella 1.11 (10/06 1430)  Circumcision  RPR Non Reactive (10/06 1430)   Pediatrician   HBsAg Negative (10/06 1430)   Support Person  Partner- Cesar HIV Non Reactive (10/06 1430)  Prenatal Classes  Discussed Varicella  Immune    GBS  (For PCN allergy, check sensitivities)   BTL Consent     VBAC Consent  Pap  under 21    Hgb Electro      CF      SMA                Previous Version       Preterm labor symptoms and general obstetric precautions including but not limited to vaginal bleeding, contractions, leaking of fluid and fetal movement were reviewed in detail with  the patient. Please refer to After Visit Summary for other counseling recommendations.  Discussed how to prepare for labor and birth- strongly encouraged to take a CBE online class and the breastfeeding class. Advised to view birth videos on YouTube.Discussed some of her fears regarding the process. She describes her mood as good, and not depressed.  Return in about 2 weeks (around 11/28/2020) for return OB.  Imagene Riches, CNM  11/14/2020 11:30 AM

## 2020-11-14 NOTE — Progress Notes (Signed)
ROB- no concerns 

## 2020-11-25 ENCOUNTER — Telehealth: Payer: Self-pay

## 2020-11-25 ENCOUNTER — Ambulatory Visit (INDEPENDENT_AMBULATORY_CARE_PROVIDER_SITE_OTHER): Payer: Medicaid Other | Admitting: Obstetrics

## 2020-11-25 ENCOUNTER — Other Ambulatory Visit: Payer: Self-pay

## 2020-11-25 VITALS — BP 128/88 | Wt 184.0 lb

## 2020-11-25 DIAGNOSIS — K829 Disease of gallbladder, unspecified: Secondary | ICD-10-CM

## 2020-11-25 DIAGNOSIS — O26893 Other specified pregnancy related conditions, third trimester: Secondary | ICD-10-CM

## 2020-11-25 DIAGNOSIS — R109 Unspecified abdominal pain: Secondary | ICD-10-CM

## 2020-11-25 DIAGNOSIS — Z3403 Encounter for supervision of normal first pregnancy, third trimester: Secondary | ICD-10-CM

## 2020-11-25 DIAGNOSIS — Z3A34 34 weeks gestation of pregnancy: Secondary | ICD-10-CM

## 2020-11-25 NOTE — Progress Notes (Signed)
Routine Prenatal Care Visit  Subjective  Teresa [name] is a 20 y.o. G1P0000 at [redacted]w[redacted]d being seen today for ongoing prenatal care.  She is currently monitored for the following issues for this high-risk pregnancy and has AD (atopic dermatitis); Family planning; Elevated TSH; History of chlamydia infection; Screening for STD (sexually transmitted disease); History of ankle sprain; MDD (major depressive disorder), recurrent episode, moderate ([place]); History of suicide attempt; Generalized anxiety disorder; Depression with anxiety; Mood changes; BV (bacterial vaginosis); Supervision of low-risk first pregnancy; and Abdominal pain in pregnancy, third trimester on their problem list.  ----------------------------------------------------------------------------------- Patient reports no bleeding, no contractions, no cramping, occasional contractions and and she has had worsening abdominal pain over the last few weeks. She has noticed this occurs more after eating meals. She also acknowledges not drinking enough water..   Contractions: Not present. Vag. Bleeding: None.  Movement: Present. Leaking Fluid denies.  ----------------------------------------------------------------------------------- The following portions of the patient's history were reviewed and updated as appropriate: allergies, current medications, past family history, past medical history, past social history, past surgical history and problem list. Problem list updated.  Objective  Blood pressure 128/88, weight 184 lb (83.5 kg), last menstrual period 03/22/2020. Pregravid weight 133 lb (60.3 kg) Total Weight Gain 51 lb (23.1 kg) Urinalysis: Urine Protein    Urine Glucose    Fetal Status:     Movement: Present     General:  Alert, oriented and cooperative. Patient is in no acute distress.  Skin: Skin is warm and dry. No rash noted.   Cardiovascular: Normal heart rate noted  Respiratory: Normal respiratory effort, no problems with  respiration noted  Abdomen: Soft, gravid, appropriate for gestational age. Pain/Pressure: Present     Pelvic:  Cervical exam performed      closed/long/high  Extremities: Normal range of motion.     Mental Status: Normal mood and affect. Normal behavior. Normal judgment and thought content.   Assessment   20 y.o. G1P0000 at [redacted]w[redacted]d by  01/02/2021, by Ultrasound presenting for routine prenatal visit  Plan   Pregnancy#1 Problems (from 03/22/20 to present)    Problem Noted Resolved   Supervision of low-risk first pregnancy 05/14/2020 by Will Bonnet, MD No   Overview Addendum 11/25/2020  1:59 PM by Imagene Riches, CNM     Nursing Staff Provider  Office Location  [facility] Dating   7wk [place]  Language  English Anatomy US  complete  Flu Vaccine   Genetic Screen  NIPS: normal xx  TDaP vaccine    Hgb A1C or  GTT Early : Third trimester : 113  Covid  J&J   LAB RESULTS   Rhogam  Not needed Blood Type O/Positive/-- (10/06 1430)   Feeding Plan  Breast Antibody Negative (10/06 1430)  Contraception None/condoms Rubella 1.11 (10/06 1430)  Circumcision NA RPR Non Reactive (10/06 1430)   Pediatrician   HBsAg Negative (10/06 1430)   Support Person  Partner- Cesar HIV Non Reactive (10/06 1430)  Prenatal Classes  Discussed Varicella  Immune    GBS  (For PCN allergy, check sensitivities)   BTL Consent     VBAC Consent  Pap  under 21    Hgb Electro      CF      SMA                Previous Version       Preterm labor symptoms and general obstetric precautions including but not limited to vaginal bleeding, contractions, leaking of  fluid and fetal movement were reviewed in detail with the patient. Please refer to After Visit Summary for other counseling recommendations.  Based on findings today, will order an outpatient gallbladder scan. F/u based on the sono findings.  Return in about 1 week (around 12/02/2020) for return OB.  Imagene Riches, CNM  11/25/2020 2:10 PM

## 2020-11-25 NOTE — Telephone Encounter (Signed)
Patient is scheduled for appt on 11/25/2020 at 1:30pm

## 2020-11-25 NOTE — Telephone Encounter (Signed)
Pt called after hour nurse 11/21/20 4:27pm c/o 34wks; having chest pain for the last three days and it's getting worse.  [phone]  Pt states the pain comes and goes and did not go to ED as per after hour nurse b/c she didn't want to go and be told the same thing.  Adv Preeclampsia sx is pain in right upper quad and she really needs to keep check on it.  Pt requests Fri's appt be moved up.

## 2020-11-25 NOTE — Progress Notes (Signed)
Pain under breast, no vb. No lof.

## 2020-11-26 ENCOUNTER — Other Ambulatory Visit: Payer: Self-pay | Admitting: Obstetrics

## 2020-11-26 DIAGNOSIS — K829 Disease of gallbladder, unspecified: Secondary | ICD-10-CM

## 2020-11-26 DIAGNOSIS — Z3A34 34 weeks gestation of pregnancy: Secondary | ICD-10-CM

## 2020-11-27 ENCOUNTER — Ambulatory Visit
Admission: RE | Admit: 2020-11-27 | Discharge: 2020-11-27 | Disposition: A | Discharge status: Home / Self Care | Payer: Medicaid Other | Source: Ambulatory Visit | Attending: Obstetrics | Admitting: Obstetrics

## 2020-11-27 ENCOUNTER — Other Ambulatory Visit: Payer: Self-pay

## 2020-11-27 DIAGNOSIS — K829 Disease of gallbladder, unspecified: Secondary | ICD-10-CM

## 2020-11-27 DIAGNOSIS — Z3A34 34 weeks gestation of pregnancy: Secondary | ICD-10-CM | POA: Diagnosis not present

## 2020-11-27 DIAGNOSIS — R1011 Right upper quadrant pain: Secondary | ICD-10-CM | POA: Diagnosis not present

## 2020-11-27 NOTE — Progress Notes (Signed)
Gallbladder study this morning shows no evidence of gallbladder disease or stones. Phone call made to the patient who is advised to start on daily Zantac or Pepcid, to avoid drinking large amounts of fluid with meals, and also encouraged to try a liquid antacid , like Maalox or Mylanta for reflux symptoms. She is reassured that her gallbladder is not the cause of her symptoms. She has a follow up appointment next week.

## 2020-11-28 ENCOUNTER — Encounter: Payer: Medicaid Other | Admitting: Obstetrics and Gynecology

## 2020-12-02 ENCOUNTER — Ambulatory Visit (INDEPENDENT_AMBULATORY_CARE_PROVIDER_SITE_OTHER): Payer: Medicaid Other | Admitting: Obstetrics

## 2020-12-02 ENCOUNTER — Other Ambulatory Visit: Payer: Self-pay

## 2020-12-02 VITALS — BP 130/70 | Ht 66.0 in | Wt 187.8 lb

## 2020-12-02 DIAGNOSIS — Z3A35 35 weeks gestation of pregnancy: Secondary | ICD-10-CM

## 2020-12-02 DIAGNOSIS — Z3403 Encounter for supervision of normal first pregnancy, third trimester: Secondary | ICD-10-CM

## 2020-12-02 LAB — POCT URINALYSIS DIPSTICK OB
Glucose, UA: NEGATIVE
POC,PROTEIN,UA: NEGATIVE

## 2020-12-02 NOTE — Progress Notes (Signed)
Routine Prenatal Care Visit  Subjective  Teresa [name] is a 20 y.o. G1P0000 at [redacted]w[redacted]d being seen today for ongoing prenatal care.  She is currently monitored for the following issues for this high-risk pregnancy and has AD (atopic dermatitis); Family planning; Elevated TSH; History of chlamydia infection; Screening for STD (sexually transmitted disease); History of ankle sprain; MDD (major depressive disorder), recurrent episode, moderate ([place]); History of suicide attempt; Generalized anxiety disorder; Depression with anxiety; Mood changes; BV (bacterial vaginosis); Supervision of low-risk first pregnancy; and Abdominal pain in pregnancy, third trimester on their problem list.  ----------------------------------------------------------------------------------- Patient reports no bleeding, no leaking and occasional contractions.  She has had daily episodes of cramping, that is at times , painful. Recent Intercourse. Contractions: Not present. Vag. Bleeding: None.  Movement: Present. Leaking Fluid denies.  ----------------------------------------------------------------------------------- The following portions of the patient's history were reviewed and updated as appropriate: allergies, current medications, past family history, past medical history, past social history, past surgical history and problem list. Problem list updated.  Objective  Blood pressure 130/70, height 5\' 6"  (1.676 m), weight 187 lb 12.8 oz (85.2 kg), last menstrual period 03/22/2020. Pregravid weight 133 lb (60.3 kg) Total Weight Gain 54 lb 12.8 oz (24.9 kg) Urinalysis: Urine Protein    Urine Glucose    Fetal Status:     Movement: Present     General:  Alert, oriented and cooperative. Patient is in no acute distress.  Skin: Skin is warm and dry. No rash noted.   Cardiovascular: Normal heart rate noted  Respiratory: Normal respiratory effort, no problems with respiration noted  Abdomen: Soft, gravid, appropriate for  gestational age. Pain/Pressure: Absent     Pelvic:  Cervical exam performed      1cm/50%/-3 cervix is posterior  Extremities: Normal range of motion.     Mental Status: Normal mood and affect. Normal behavior. Normal judgment and thought content.   Assessment   20 y.o. G1P0000 at [redacted]w[redacted]d by  01/02/2021, by Ultrasound presenting for routine prenatal visit  Plan   Pregnancy#1 Problems (from 03/22/20 to present)    Problem Noted Resolved   Supervision of low-risk first pregnancy 05/14/2020 by Will Bonnet, MD No   Overview Addendum 11/25/2020  1:59 PM by Imagene Riches, CNM     Nursing Staff Provider  Office Location  [facility] Dating   7wk [place]  Language  English Anatomy US  complete  Flu Vaccine   Genetic Screen  NIPS: normal xx  TDaP vaccine    Hgb A1C or  GTT Early : Third trimester : 113  Covid  J&J   LAB RESULTS   Rhogam  Not needed Blood Type O/Positive/-- (10/06 1430)   Feeding Plan  Breast Antibody Negative (10/06 1430)  Contraception None/condoms Rubella 1.11 (10/06 1430)  Circumcision NA RPR Non Reactive (10/06 1430)   Pediatrician   HBsAg Negative (10/06 1430)   Support Person  Partner- Cesar HIV Non Reactive (10/06 1430)  Prenatal Classes  Discussed Varicella  Immune    GBS  (For PCN allergy, check sensitivities)   BTL Consent     VBAC Consent  Pap  under 21    Hgb Electro      CF      SMA                Previous Version       Term labor symptoms and general obstetric precautions including but not limited to vaginal bleeding, contractions, leaking of fluid and fetal movement  were reviewed in detail with the patient. Please refer to After Visit Summary for other counseling recommendations.  Discussed pelvic rest until 37 weeks.  Return in about 1 week (around 12/09/2020) for return OB, GBS  culture.  Imagene Riches, CNM  12/02/2020 3:27 PM

## 2020-12-09 ENCOUNTER — Other Ambulatory Visit (HOSPITAL_COMMUNITY)
Admission: RE | Admit: 2020-12-09 | Discharge: 2020-12-09 | Disposition: A | Discharge status: Home / Self Care | Payer: Medicaid Other | Source: Ambulatory Visit | Attending: Obstetrics and Gynecology | Admitting: Obstetrics and Gynecology

## 2020-12-09 ENCOUNTER — Ambulatory Visit (INDEPENDENT_AMBULATORY_CARE_PROVIDER_SITE_OTHER): Payer: Medicaid Other | Admitting: Obstetrics and Gynecology

## 2020-12-09 ENCOUNTER — Other Ambulatory Visit: Payer: Self-pay

## 2020-12-09 VITALS — BP 118/68 | Wt 189.0 lb

## 2020-12-09 DIAGNOSIS — Z3A36 36 weeks gestation of pregnancy: Secondary | ICD-10-CM

## 2020-12-09 DIAGNOSIS — Z113 Encounter for screening for infections with a predominantly sexual mode of transmission: Secondary | ICD-10-CM

## 2020-12-09 DIAGNOSIS — Z3403 Encounter for supervision of normal first pregnancy, third trimester: Secondary | ICD-10-CM

## 2020-12-09 DIAGNOSIS — Z3685 Encounter for antenatal screening for Streptococcus B: Secondary | ICD-10-CM | POA: Diagnosis not present

## 2020-12-09 DIAGNOSIS — Z23 Encounter for immunization: Secondary | ICD-10-CM | POA: Diagnosis not present

## 2020-12-09 LAB — POCT URINALYSIS DIPSTICK OB
Glucose, UA: NEGATIVE
POC,PROTEIN,UA: NEGATIVE

## 2020-12-09 LAB — OB RESULTS CONSOLE GC/CHLAMYDIA: Gonorrhea: NEGATIVE

## 2020-12-09 NOTE — Progress Notes (Signed)
Routine Prenatal Care Visit  Subjective  Teresa [name] is a 20 y.o. G1P0000 at [redacted]w[redacted]d being seen today for ongoing prenatal care.  She is currently monitored for the following issues for this low-risk pregnancy and has AD (atopic dermatitis); Elevated TSH; History of chlamydia infection; History of ankle sprain; MDD (major depressive disorder), recurrent episode, moderate ([place]); History of suicide attempt; Generalized anxiety disorder; Depression with anxiety; Mood changes; and Supervision of low-risk first pregnancy on their problem list.  ----------------------------------------------------------------------------------- Patient reports increased vaginal pressure and intermittent contractions. Reports she may have lost her mucous plug..   Contractions: Irregular. Vag. Bleeding: None.  Movement: Present. Denies leaking of fluid.  ----------------------------------------------------------------------------------- The following portions of the patient's history were reviewed and updated as appropriate: allergies, current medications, past family history, past medical history, past social history, past surgical history and problem list. Problem list updated.   Objective  Blood pressure 118/68, weight 189 lb (85.7 kg), last menstrual period 03/22/2020. Pregravid weight 133 lb (60.3 kg) Total Weight Gain 56 lb (25.4 kg) Urinalysis:      Fetal Status: Fetal Heart Rate (bpm): 135 Fundal Height: 37 cm Movement: Present  Presentation: Vertex  General:  Alert, oriented and cooperative. Patient is in no acute distress.  Skin: Skin is warm and dry. No rash noted.   Cardiovascular: Normal heart rate noted  Respiratory: Normal respiratory effort, no problems with respiration noted  Abdomen: Soft, gravid, appropriate for gestational age. Pain/Pressure: Present     Pelvic:  Cervical exam performed Dilation: 1 Effacement (%): 50 Station: -3  Extremities: Normal range of motion.     ental Status: Normal  mood and affect. Normal behavior. Normal judgment and thought content.     Assessment   20 y.o. G1P0000 at [redacted]w[redacted]d by  01/02/2021, by Ultrasound presenting for routine prenatal visit  Plan   Pregnancy#1 Problems (from 03/22/20 to present)    Problem Noted Resolved   Supervision of low-risk first pregnancy 05/14/2020 by Will Bonnet, MD No   Overview Addendum 12/09/2020  3:24 PM by Orlie Pollen, CNM     Nursing Staff Provider  Office Location  [facility] Dating   7wk [place]  Language  English Anatomy US  complete  Flu Vaccine   declined Genetic Screen  NIPS: normal xx  TDaP vaccine   12/09/20 Hgb A1C or  GTT Early : n/a Third trimester : 113  Covid  J&J   LAB RESULTS   Rhogam  Not needed Blood Type O/Positive/-- (10/06 1430)   Feeding Plan  Breast Antibody Negative (10/06 1430)  Contraception None/condoms Rubella 1.11 (10/06 1430)  Circumcision NA RPR Non Reactive (10/06 1430)   Pediatrician   HBsAg Negative (10/06 1430)   Support Person  Partner- Cesar HIV Non Reactive (10/06 1430)  Prenatal Classes  Discussed Varicella  Immune    GBS  (For PCN allergy, check sensitivities)   BTL Consent  n/a    VBAC Consent  n/a Pap  under 21    Hgb Electro      CF      SMA                Previous Version      -Discussed concerns around labor experience. Reviewed options for pain management in labor. -GBS/Aptima collected today.  Preterm precautions including but not limited to vaginal bleeding, contractions, leaking of fluid and fetal movement were reviewed in detail with the patient.    Return in about 1 week (around 12/16/2020)  for ROB.  Orlie Pollen, CNM, MSN [facility], [facility] 12/09/2020, 3:24 PM

## 2020-12-11 LAB — CERVICOVAGINAL ANCILLARY ONLY
Chlamydia: NEGATIVE
Comment: NEGATIVE
Comment: NEGATIVE
Comment: NORMAL
Neisseria Gonorrhea: NEGATIVE
Trichomonas: NEGATIVE

## 2020-12-11 LAB — STREP GP B NAA: Strep Gp B NAA: NEGATIVE

## 2020-12-16 ENCOUNTER — Other Ambulatory Visit: Payer: Self-pay

## 2020-12-16 ENCOUNTER — Ambulatory Visit (INDEPENDENT_AMBULATORY_CARE_PROVIDER_SITE_OTHER): Payer: Medicaid Other | Admitting: Obstetrics and Gynecology

## 2020-12-16 VITALS — BP 120/70 | Ht 66.0 in | Wt 193.0 lb

## 2020-12-16 DIAGNOSIS — Z3403 Encounter for supervision of normal first pregnancy, third trimester: Secondary | ICD-10-CM

## 2020-12-16 DIAGNOSIS — Z3A37 37 weeks gestation of pregnancy: Secondary | ICD-10-CM

## 2020-12-16 LAB — POCT URINALYSIS DIPSTICK OB
Glucose, UA: NEGATIVE
POC,PROTEIN,UA: NEGATIVE

## 2020-12-16 NOTE — Progress Notes (Signed)
Routine Prenatal Care Visit  Subjective  Teresa [name] is a 20 y.o. G1P0000 at [redacted]w[redacted]d being seen today for ongoing prenatal care.  She is currently monitored for the following issues for this low-risk pregnancy and has AD (atopic dermatitis); Elevated TSH; History of chlamydia infection; History of ankle sprain; MDD (major depressive disorder), recurrent episode, moderate ([place]); History of suicide attempt; Generalized anxiety disorder; Depression with anxiety; and Supervision of low-risk first pregnancy on their problem list.  ----------------------------------------------------------------------------------- Patient reports no complaints.   Contractions: Irregular. Vag. Bleeding: None.  Movement: Present. Denies leaking of fluid.  ----------------------------------------------------------------------------------- The following portions of the patient's history were reviewed and updated as appropriate: allergies, current medications, past family history, past medical history, past social history, past surgical history and problem list. Problem list updated.   Objective  Blood pressure 120/70, height 5\' 6"  (1.676 m), weight 193 lb (87.5 kg), last menstrual period 03/22/2020. Pregravid weight 133 lb (60.3 kg) Total Weight Gain 60 lb (27.2 kg) Urinalysis:      Fetal Status: Fetal Heart Rate (bpm): 140 Fundal Height: 37 cm Movement: Present  Presentation: Vertex  General:  Alert, oriented and cooperative. Patient is in no acute distress.  Skin: Skin is warm and dry. No rash noted.   Cardiovascular: Normal heart rate noted  Respiratory: Normal respiratory effort, no problems with respiration noted  Abdomen: Soft, gravid, appropriate for gestational age. Pain/Pressure: Absent     Pelvic:  Cervical exam performed Dilation: 1 Effacement (%): 50 Station: -3  Extremities: Normal range of motion.     ental Status: Normal mood and affect. Normal behavior. Normal judgment and thought content.      Assessment   20 y.o. G1P0000 at 109w4d by  01/02/2021, by Ultrasound presenting for routine prenatal visit  Plan   Pregnancy#1 Problems (from 03/22/20 to present)    Problem Noted Resolved   Supervision of low-risk first pregnancy 05/14/2020 by Will Bonnet, MD No   Overview Addendum 12/09/2020  3:24 PM by Orlie Pollen, CNM     Nursing Staff Provider  Office Location  [facility] Dating   7wk [place]  Language  English Anatomy US  complete  Flu Vaccine   declined Genetic Screen  NIPS: normal xx  TDaP vaccine   12/09/20 Hgb A1C or  GTT Early : n/a Third trimester : 113  Covid  J&J   LAB RESULTS   Rhogam  Not needed Blood Type O/Positive/-- (10/06 1430)   Feeding Plan  Breast Antibody Negative (10/06 1430)  Contraception None/condoms Rubella 1.11 (10/06 1430)  Circumcision NA RPR Non Reactive (10/06 1430)   Pediatrician   HBsAg Negative (10/06 1430)   Support Person  Partner- Cesar HIV Non Reactive (10/06 1430)  Prenatal Classes  Discussed Varicella  Immune    GBS  (For PCN allergy, check sensitivities)   BTL Consent  n/a    VBAC Consent  n/a Pap  under 21    Hgb Electro      CF      SMA                Previous Version      -Patient desires 39 week IOL - scheduled today for 12/29/20 at 0800  Term labor precautions including but not limited to vaginal bleeding, contractions, leaking of fluid and fetal movement were reviewed in detail with the patient.    Return in about 1 week (around 12/23/2020) for [place].  Orlie Pollen, CNM, MSN [facility], [facility] 12/16/2020, 2:30 PM

## 2020-12-16 NOTE — Progress Notes (Signed)
  [facility] INDUCTION ASSESSMENT SCHEDULING Teresa [name] 2001/04/08 Medical record #: [id number] Phone #:  Home Phone [phone]  Mobile [phone]    Prenatal Provider:[facility]ivering Group:[facility] Proposed admission date/time: 12/29/20 at 0800 Method of induction:Cytotec  Weight: Filed Weights05/10/22 1357Weight:193 lb (87.5 kg) BMI Body mass index is 31.15 kg/m. HIV Negative HSV Negative EDC Estimated Date of Delivery: 5/27/22based on:US at [redacted] wks  Gestational age on admission: [redacted]w[redacted]d Gravidity/parity:G1P0000  Cervix Score   0 1 2 3   Position Posterior Midposition Anterior   Consistency Firm Medium Soft   Effacement (%) 0-30 40-50 60-70 >80  Dilation (cm) Closed 1-2 3-4 >5  Baby's station -3 -2 -1 +1, +2   Bishop Score:4   Medical induction of labor  select indication(s) below Elective induction ?39 weeks multiparous patient ?39 weeks primiparous patient with Bishop score ?7 ?40 weeks primiparous patient   Medical Indications Adapted from [facility] #[id number], "Medically Indicated Late Preterm and Early Term Deliveries," 2013.  PLACENTAL / UTERINE ISSUES FETAL ISSUES MATERNAL ISSUES  ? Placenta previa (36.0-37.6) ? Isoimmunization (37.0-38.6) ? Preeclampsia without severe features or gestational HTN (37.0)  ? Suspected accreta (34.0-35.6) ? Growth Restriction Nelda [place]) ? Preeclampsia with severe features (34.0)  ? Prior classical CD, uterine window, rupture (36.0-37.6) ? Isolated (38.0-39.6) ? Chronic HTN (38.0-39.6)  ? Prior myomectomy (37.0-38.6) ? Concurrent findings (34.0-37.6) ? Cholestasis (37.0)  ? Umbilical vein varix (65.7) ? Growth Restriction (Twins) ? Diabetes  ? Placental abruption (chronic) ? Di-Di Isolated (36.0-37.6) ? Pregestational, controlled (39.0)  OBSTETRIC ISSUES ? Di-Di concurrent findings (32.0-34.6) ? Pregestational, uncontrolled (37.0-39.0)  ? Postdates ? (41 weeks) ? Mo-Di isolated (32.0-34.6)  ? Pregestational, vascular compromise (37.0- 39.0)  ? PPROM (34.0) ? Multiple Gestation ? Gestational, diet controlled (40.0)  ? Hx of IUFD (39.0 weeks) ? Di-Di (38.0-38.6) ? Gestational, med controlled (39.0)  ? Polyhydramnios, mild/moderate; SDV 8-16 or AFI 25-35 (39.0) ? Mo-Di (36.0-37.6) ? Gestational, uncontrolled (38.0-39.0)  ? Oligohydramnios (36.0-37.6); MVP <2 cm  For indications not listed above, delivery recommendations from maternal-fetal medicine consultant occurred on: Date: n/a  Provider Signature: Maguire Killmer Scheduled by: Aris Lot, RN Date:12/16/2020 2:31 PM   Call [phone] to finalize the induction date/time  [id number] (07/17)

## 2020-12-23 ENCOUNTER — Other Ambulatory Visit: Payer: Self-pay

## 2020-12-23 ENCOUNTER — Ambulatory Visit (INDEPENDENT_AMBULATORY_CARE_PROVIDER_SITE_OTHER): Payer: Medicaid Other | Admitting: Obstetrics

## 2020-12-23 VITALS — BP 122/76 | Wt 191.0 lb

## 2020-12-23 DIAGNOSIS — Z3A38 38 weeks gestation of pregnancy: Secondary | ICD-10-CM

## 2020-12-23 DIAGNOSIS — Z3403 Encounter for supervision of normal first pregnancy, third trimester: Secondary | ICD-10-CM

## 2020-12-23 NOTE — Progress Notes (Signed)
Routine Prenatal Care Visit  Subjective  Teresa [name] is a 20 y.o. G1P0000 at [redacted]w[redacted]d being seen today for ongoing prenatal care.  She is currently monitored for the following issues for this low-risk pregnancy and has AD (atopic dermatitis); Elevated TSH; History of chlamydia infection; History of ankle sprain; MDD (major depressive disorder), recurrent episode, moderate ([place]); History of suicide attempt; Generalized anxiety disorder; Depression with anxiety; and Supervision of low-risk first pregnancy on their problem list.  ----------------------------------------------------------------------------------- Patient reports no complaints.  Having [employer] ucs a lot. Has an IOL set up for Monday- has changed the time to 0500.  .  .   . Leaking Fluid denies.  ----------------------------------------------------------------------------------- The following portions of the patient's history were reviewed and updated as appropriate: allergies, current medications, past family history, past medical history, past social history, past surgical history and problem list. Problem list updated.  Objective  Blood pressure 122/76, weight 191 lb (86.6 kg), last menstrual period 03/22/2020. Pregravid weight 133 lb (60.3 kg) Total Weight Gain 58 lb (26.3 kg) Urinalysis: Urine Protein    Urine Glucose    Fetal Status:           General:  Alert, oriented and cooperative. Patient is in no acute distress.  Skin: Skin is warm and dry. No rash noted.   Cardiovascular: Normal heart rate noted  Respiratory: Normal respiratory effort, no problems with respiration noted  Abdomen: Soft, gravid, appropriate for gestational age.       Pelvic:  Cervical exam performed      posterior cervix: 1.5 cms/70%/-3  Extremities: Normal range of motion.     Mental Status: Normal mood and affect. Normal behavior. Normal judgment and thought content.   Assessment   20 y.o. G1P0000 at [redacted]w[redacted]d by  01/02/2021, by Ultrasound  presenting for routine prenatal visit  Plan   Pregnancy#1 Problems (from 03/22/20 to present)    Problem Noted Resolved   Supervision of low-risk first pregnancy 05/14/2020 by Will Bonnet, MD No   Overview Addendum 12/23/2020  3:39 PM by Imagene Riches, CNM     Nursing Staff Provider  Office Location  [facility] Dating   7wk [place]  Language  English Anatomy US  complete  Flu Vaccine   declined Genetic Screen  NIPS: normal xx  TDaP vaccine   12/09/20 Hgb A1C or  GTT Early : n/a Third trimester : 113  Covid  J&J   LAB RESULTS   Rhogam  Not needed Blood Type O/Positive/-- (10/06 1430)   Feeding Plan  Breast Antibody Negative (10/06 1430)  Contraception None/condoms Rubella 1.11 (10/06 1430)  Circumcision NA RPR Non Reactive (10/06 1430)   Pediatrician   HBsAg Negative (10/06 1430)   Support Person  Partner- Cesar HIV Non Reactive (10/06 1430)  Prenatal Classes  Discussed Varicella  Immune    GBS  (For PCN allergy, check sensitivities) negative  BTL Consent  n/a    VBAC Consent  n/a Pap  under 21    Hgb Electro      CF      SMA                Previous Version       Term labor symptoms and general obstetric precautions including but not limited to vaginal bleeding, contractions, leaking of fluid and fetal movement were reviewed in detail with the patient. Please refer to After Visit Summary for other counseling recommendations.  Discussed her IOL and reviewed basic breastfeeding rips for  getting started.  Return in about 7 weeks (around 02/10/2021) for 6 week postpartum.  Imagene Riches, CNM  12/23/2020 4:03 PM

## 2020-12-26 ENCOUNTER — Other Ambulatory Visit: Payer: Self-pay

## 2020-12-26 ENCOUNTER — Other Ambulatory Visit: Payer: Self-pay | Admitting: Obstetrics

## 2020-12-26 ENCOUNTER — Other Ambulatory Visit
Admission: RE | Admit: 2020-12-26 | Discharge: 2020-12-26 | Disposition: A | Discharge status: Home / Self Care | Payer: Medicaid Other | Source: Ambulatory Visit | Attending: Obstetrics and Gynecology | Admitting: Obstetrics and Gynecology

## 2020-12-26 DIAGNOSIS — Z01812 Encounter for preprocedural laboratory examination: Secondary | ICD-10-CM | POA: Insufficient documentation

## 2020-12-26 DIAGNOSIS — Z20822 Contact with and (suspected) exposure to covid-19: Secondary | ICD-10-CM | POA: Diagnosis not present

## 2020-12-26 LAB — SARS CORONAVIRUS 2 (TAT 6-24 HRS): SARS Coronavirus 2: NEGATIVE

## 2020-12-27 ENCOUNTER — Encounter: Payer: Self-pay | Admitting: Obstetrics and Gynecology

## 2020-12-27 ENCOUNTER — Other Ambulatory Visit: Payer: Self-pay

## 2020-12-27 ENCOUNTER — Observation Stay
Admission: EM | Admit: 2020-12-27 | Discharge: 2020-12-27 | Disposition: A | Discharge status: Home / Self Care | Payer: Medicaid Other | Attending: Obstetrics and Gynecology | Admitting: Obstetrics and Gynecology

## 2020-12-27 DIAGNOSIS — Z3A39 39 weeks gestation of pregnancy: Secondary | ICD-10-CM | POA: Insufficient documentation

## 2020-12-27 DIAGNOSIS — O471 False labor at or after 37 completed weeks of gestation: Secondary | ICD-10-CM | POA: Insufficient documentation

## 2020-12-27 DIAGNOSIS — O4292 Full-term premature rupture of membranes, unspecified as to length of time between rupture and onset of labor: Secondary | ICD-10-CM | POA: Diagnosis not present

## 2020-12-27 DIAGNOSIS — Z0371 Encounter for suspected problem with amniotic cavity and membrane ruled out: Principal | ICD-10-CM | POA: Insufficient documentation

## 2020-12-27 DIAGNOSIS — Z3403 Encounter for supervision of normal first pregnancy, third trimester: Secondary | ICD-10-CM

## 2020-12-27 LAB — RUPTURE OF MEMBRANE (ROM)PLUS: Rom Plus: NEGATIVE

## 2020-12-27 LAB — WET PREP, GENITAL
Clue Cells Wet Prep HPF POC: NONE SEEN
Sperm: NONE SEEN
Trich, Wet Prep: NONE SEEN
Yeast Wet Prep HPF POC: NONE SEEN

## 2020-12-27 MED ORDER — ACETAMINOPHEN 325 MG PO TABS
650.0000 mg | ORAL_TABLET | ORAL | Status: DC | PRN
Start: 2020-12-27 — End: 2020-12-27

## 2020-12-27 NOTE — OB Triage Note (Signed)
Pt reports possible rupture of membranes today @ 1010. Denies color, odor. Reports + fetal movement. Denies vaginal bleeding. Teresa [name]

## 2020-12-27 NOTE — Discharge Summary (Signed)
Physician Final Progress Note  Patient ID: Teresa [name] MRN: [id number] DOB/AGE: 2000/11/20 20 y.o.  Admit date: 12/27/2020 Admitting provider: Malachy Mood, MD Discharge date: 12/27/2020   Admission Diagnoses: Leaking fluid 3rd trimester   Discharge Diagnoses:  Active Problems:   Labor and delivery indication for care or intervention  20 y.o. G1P0000 at [redacted]w[redacted]d presenting with complaints of leaking fluid.  ROM plus obtained and negative.  Wet mount obtained and also negative.  +FM, no VB, irregular contractions.  Pregnancy#1 Problems (from 03/22/20 to present)    Problem Noted Resolved   Supervision of low-risk first pregnancy 05/14/2020 by Will Bonnet, MD No   Overview Addendum 12/23/2020  3:39 PM by Imagene Riches, CNM     Nursing Staff Provider  Office Location  [facility] Dating   7wk [place]  Language  English Anatomy US  complete  Flu Vaccine   declined Genetic Screen  NIPS: normal xx  TDaP vaccine   12/09/20 Hgb A1C or  GTT Early : n/a Third trimester : 113  Covid  J&J   LAB RESULTS   Rhogam  Not needed Blood Type O/Positive/-- (10/06 1430)   Feeding Plan  Breast Antibody Negative (10/06 1430)  Contraception None/condoms Rubella 1.11 (10/06 1430)  Circumcision NA RPR Non Reactive (10/06 1430)   Pediatrician   HBsAg Negative (10/06 1430)   Support Person  Partner- Cesar HIV Non Reactive (10/06 1430)  Prenatal Classes  Discussed Varicella  Immune    GBS  (For PCN allergy, check sensitivities) negative  BTL Consent  n/a    VBAC Consent  n/a Pap  under 21    Hgb Electro      CF      SMA                Previous Version     Blood pressure 109/75, pulse (!) 109, temperature 98.1 F (36.7 C), temperature source Oral, resp. rate 16, height 5\' 6"  (1.676 m), weight 87.5 kg, last menstrual period 03/22/2020.   Consults: None  Significant Findings/ Diagnostic Studies:  Results for orders placed or performed during the hospital encounter of 12/27/20 (from the  past 24 hour(s))  ROM Plus ([facility] only)     Status: None   Collection Time: 12/27/20 12:56 PM  Result Value Ref Range   Rom Plus NEGATIVE   Wet prep, genital     Status: Abnormal   Collection Time: 12/27/20  1:43 PM   Specimen: Vaginal  Result Value Ref Range   Yeast Wet Prep HPF POC NONE SEEN NONE SEEN   Trich, Wet Prep NONE SEEN NONE SEEN   Clue Cells Wet Prep HPF POC NONE SEEN NONE SEEN   WBC, Wet Prep HPF POC FEW (A) NONE SEEN   Sperm NONE SEEN      Procedures:   Baseline: 125-130 Variability: moderate Accelerations: present Decelerations: absent Tocometry: irregular The patient was monitored for 30 minutes, fetal heart rate tracing was deemed reactive, category I tracing,  CPT [id number]   Discharge Condition: good  Disposition: Discharge disposition: 01-Home or Self Care       Diet: Regular diet  Discharge Activity: Activity as tolerated  Discharge Instructions    Discharge activity:  No Restrictions   Complete by: As directed    Discharge diet:  No restrictions   Complete by: As directed    Fetal Kick Count:  Lie on our left side for one hour after a meal, and count the number of times your  baby kicks.  If it is less than 5 times, get up, move around and drink some juice.  Repeat the test 30 minutes later.  If it is still less than 5 kicks in an hour, notify your doctor.   Complete by: As directed    LABOR:  When conractions begin, you should start to time them from the beginning of one contraction to the beginning  of the next.  When contractions are 5 - 10 minutes apart or less and have been regular for at least an hour, you should call your health care provider.   Complete by: As directed    No sexual activity restrictions   Complete by: As directed    Notify physician for bleeding from the vagina   Complete by: As directed    Notify physician for blurring of vision or spots before the eyes   Complete by: As directed    Notify physician for chills or fever    Complete by: As directed    Notify physician for fainting spells, "black outs" or loss of consciousness   Complete by: As directed    Notify physician for increase in vaginal discharge   Complete by: As directed    Notify physician for leaking of fluid   Complete by: As directed    Notify physician for pain or burning when urinating   Complete by: As directed    Notify physician for pelvic pressure (sudden increase)   Complete by: As directed    Notify physician for severe or continued nausea or vomiting   Complete by: As directed    Notify physician for sudden gushing of fluid from the vagina (with or without continued leaking)   Complete by: As directed    Notify physician for sudden, constant, or occasional abdominal pain   Complete by: As directed    Notify physician if baby moving less than usual   Complete by: As directed      Allergies as of 12/27/2020   No Known Allergies     Medication List    TAKE these medications   Prenatal Vitamins 28-0.8 MG Tabs Take 1 tablet by mouth daily.        Total time spent taking care of this patient: triaged remotely 18min  Signed: Malachy Mood 12/27/2020, 2:16 PM

## 2020-12-27 NOTE — Progress Notes (Signed)
Discharge home ambulatory with mother. Discharge instructions given. Pt is scheduled for IOL tomorrow night at midnight. Labor precautions given.  Dineen Kid

## 2020-12-29 ENCOUNTER — Inpatient Hospital Stay: Payer: Medicaid Other | Admitting: Anesthesiology

## 2020-12-29 ENCOUNTER — Encounter: Payer: Self-pay | Admitting: Obstetrics & Gynecology

## 2020-12-29 ENCOUNTER — Other Ambulatory Visit: Payer: Self-pay

## 2020-12-29 ENCOUNTER — Inpatient Hospital Stay
Admission: EM | Admit: 2020-12-29 | Discharge: 2020-12-31 | DRG: 768 | Disposition: A | Discharge status: Home / Self Care | Payer: Medicaid Other | Attending: Obstetrics & Gynecology | Admitting: Obstetrics & Gynecology

## 2020-12-29 DIAGNOSIS — R509 Fever, unspecified: Secondary | ICD-10-CM | POA: Diagnosis not present

## 2020-12-29 DIAGNOSIS — Z3A39 39 weeks gestation of pregnancy: Secondary | ICD-10-CM | POA: Diagnosis not present

## 2020-12-29 DIAGNOSIS — R109 Unspecified abdominal pain: Secondary | ICD-10-CM | POA: Diagnosis present

## 2020-12-29 DIAGNOSIS — R519 Headache, unspecified: Secondary | ICD-10-CM | POA: Diagnosis not present

## 2020-12-29 DIAGNOSIS — Z87891 Personal history of nicotine dependence: Secondary | ICD-10-CM | POA: Diagnosis not present

## 2020-12-29 DIAGNOSIS — N134 Hydroureter: Secondary | ICD-10-CM | POA: Diagnosis not present

## 2020-12-29 DIAGNOSIS — Z20822 Contact with and (suspected) exposure to covid-19: Secondary | ICD-10-CM | POA: Diagnosis not present

## 2020-12-29 DIAGNOSIS — Z3490 Encounter for supervision of normal pregnancy, unspecified, unspecified trimester: Secondary | ICD-10-CM | POA: Diagnosis present

## 2020-12-29 DIAGNOSIS — O26893 Other specified pregnancy related conditions, third trimester: Secondary | ICD-10-CM | POA: Diagnosis not present

## 2020-12-29 LAB — CBC
HCT: 29.6 % — ABNORMAL LOW (ref 36.0–46.0)
Hemoglobin: 10.4 g/dL — ABNORMAL LOW (ref 12.0–15.0)
MCH: 31.6 pg (ref 26.0–34.0)
MCHC: 35.1 g/dL (ref 30.0–36.0)
MCV: 90 fL (ref 80.0–100.0)
Platelets: 168 10*3/uL (ref 150–400)
RBC: 3.29 MIL/uL — ABNORMAL LOW (ref 3.87–5.11)
RDW: 12.6 % (ref 11.5–15.5)
WBC: 9.6 10*3/uL (ref 4.0–10.5)
nRBC: 0.2 % (ref 0.0–0.2)

## 2020-12-29 LAB — URINE DRUG SCREEN, QUALITATIVE (ARMC ONLY)
Amphetamines, Ur Screen: NOT DETECTED
Barbiturates, Ur Screen: NOT DETECTED
Benzodiazepine, Ur Scrn: NOT DETECTED
Cannabinoid 50 Ng, Ur ~~LOC~~: NOT DETECTED
Cocaine Metabolite,Ur ~~LOC~~: NOT DETECTED
MDMA (Ecstasy)Ur Screen: NOT DETECTED
Methadone Scn, Ur: NOT DETECTED
Opiate, Ur Screen: NOT DETECTED
Phencyclidine (PCP) Ur S: NOT DETECTED
Tricyclic, Ur Screen: NOT DETECTED

## 2020-12-29 LAB — TYPE AND SCREEN
ABO/RH(D): O POS
Antibody Screen: NEGATIVE

## 2020-12-29 LAB — RPR: RPR Ser Ql: NONREACTIVE

## 2020-12-29 MED ORDER — ONDANSETRON HCL 4 MG/2ML IJ SOLN: 4.0000 mg | INTRAMUSCULAR | Status: DC | PRN

## 2020-12-29 MED ORDER — EPHEDRINE 5 MG/ML INJ: 10.0000 mg | INTRAVENOUS | Status: DC | PRN

## 2020-12-29 MED ORDER — ONDANSETRON HCL 4 MG/2ML IJ SOLN: 4.0000 mg | Freq: Four times a day (QID) | INTRAMUSCULAR | Status: DC | PRN

## 2020-12-29 MED ORDER — FENTANYL 2.5 MCG/ML W/ROPIVACAINE 0.15% IN NS 100 ML EPIDURAL (ARMC): 12.0000 mL/h | EPIDURAL | Status: DC

## 2020-12-29 MED ORDER — LACTATED RINGERS IV SOLN
500.0000 mL | Freq: Once | INTRAVENOUS | Status: AC
  Administered 2020-12-29: 500 mL via INTRAVENOUS

## 2020-12-29 MED ORDER — ACETAMINOPHEN 325 MG PO TABS: 650.0000 mg | ORAL_TABLET | ORAL | Status: DC | PRN

## 2020-12-29 MED ORDER — PHENYLEPHRINE 40 MCG/ML (10ML) SYRINGE FOR IV PUSH (FOR BLOOD PRESSURE SUPPORT): 80.0000 ug | PREFILLED_SYRINGE | INTRAVENOUS | Status: DC | PRN

## 2020-12-29 MED ORDER — AMMONIA AROMATIC IN INHA
RESPIRATORY_TRACT | Status: AC
  Filled 2020-12-29: qty 10

## 2020-12-29 MED ORDER — OXYTOCIN-SODIUM CHLORIDE 30-0.9 UT/500ML-% IV SOLN: 1.0000 m[IU]/min | INTRAVENOUS | Status: DC

## 2020-12-29 MED ORDER — OXYTOCIN BOLUS FROM INFUSION
333.0000 mL | Freq: Once | INTRAVENOUS | Status: AC
  Administered 2020-12-29: 333 mL via INTRAVENOUS

## 2020-12-29 MED ORDER — SENNOSIDES-DOCUSATE SODIUM 8.6-50 MG PO TABS
2.0000 | ORAL_TABLET | ORAL | Status: DC
  Administered 2020-12-30: 2 via ORAL
  Filled 2020-12-29 (×2): qty 2

## 2020-12-29 MED ORDER — LIDOCAINE HCL (PF) 1 % IJ SOLN
30.0000 mL | INTRAMUSCULAR | Status: DC | PRN
  Filled 2020-12-29: qty 30

## 2020-12-29 MED ORDER — BUPIVACAINE HCL (PF) 0.25 % IJ SOLN
INTRAMUSCULAR | Status: DC | PRN
  Administered 2020-12-29 (×2): 5 mL via EPIDURAL

## 2020-12-29 MED ORDER — DIPHENHYDRAMINE HCL 25 MG PO CAPS: 25.0000 mg | ORAL_CAPSULE | Freq: Four times a day (QID) | ORAL | Status: DC | PRN

## 2020-12-29 MED ORDER — ONDANSETRON HCL 4 MG PO TABS
4.0000 mg | ORAL_TABLET | ORAL | Status: DC | PRN
  Filled 2020-12-29: qty 1

## 2020-12-29 MED ORDER — MISOPROSTOL 25 MCG QUARTER TABLET
25.0000 ug | ORAL_TABLET | ORAL | Status: DC | PRN
  Administered 2020-12-29: 25 ug via VAGINAL
  Filled 2020-12-29: qty 1

## 2020-12-29 MED ORDER — COCONUT OIL OIL: 1.0000 "application " | TOPICAL_OIL | Status: DC | PRN

## 2020-12-29 MED ORDER — ACETAMINOPHEN 325 MG PO TABS
650.0000 mg | ORAL_TABLET | ORAL | Status: DC | PRN
  Administered 2020-12-29 – 2020-12-30 (×4): 650 mg via ORAL
  Filled 2020-12-29 (×4): qty 2

## 2020-12-29 MED ORDER — METHYLERGONOVINE MALEATE 0.2 MG/ML IJ SOLN
INTRAMUSCULAR | Status: AC
  Administered 2020-12-29: 0.2 mg via INTRAMUSCULAR
  Filled 2020-12-29: qty 1

## 2020-12-29 MED ORDER — SODIUM CHLORIDE 0.9% FLUSH: 3.0000 mL | INTRAVENOUS | Status: DC | PRN

## 2020-12-29 MED ORDER — SODIUM CHLORIDE 0.9 % IV SOLN: 250.0000 mL | INTRAVENOUS | Status: DC | PRN

## 2020-12-29 MED ORDER — BENZOCAINE-MENTHOL 20-0.5 % EX AERO
1.0000 "application " | INHALATION_SPRAY | CUTANEOUS | Status: DC | PRN
  Filled 2020-12-29: qty 56

## 2020-12-29 MED ORDER — SODIUM CHLORIDE 0.9% FLUSH
3.0000 mL | Freq: Two times a day (BID) | INTRAVENOUS | Status: DC
  Administered 2020-12-29: 3 mL via INTRAVENOUS

## 2020-12-29 MED ORDER — OXYCODONE-ACETAMINOPHEN 5-325 MG PO TABS: 1.0000 | ORAL_TABLET | ORAL | Status: DC | PRN

## 2020-12-29 MED ORDER — DIBUCAINE (PERIANAL) 1 % EX OINT
1.0000 "application " | TOPICAL_OINTMENT | CUTANEOUS | Status: DC | PRN
  Filled 2020-12-29: qty 28

## 2020-12-29 MED ORDER — OXYTOCIN 10 UNIT/ML IJ SOLN
INTRAMUSCULAR | Status: AC
  Filled 2020-12-29: qty 2

## 2020-12-29 MED ORDER — OXYTOCIN-SODIUM CHLORIDE 30-0.9 UT/500ML-% IV SOLN
2.5000 [IU]/h | INTRAVENOUS | Status: DC
  Filled 2020-12-29: qty 500

## 2020-12-29 MED ORDER — DIPHENHYDRAMINE HCL 50 MG/ML IJ SOLN: 12.5000 mg | INTRAMUSCULAR | Status: DC | PRN

## 2020-12-29 MED ORDER — TETANUS-DIPHTH-ACELL PERTUSSIS 5-2.5-18.5 LF-MCG/0.5 IM SUSY
0.5000 mL | PREFILLED_SYRINGE | Freq: Once | INTRAMUSCULAR | Status: DC
  Filled 2020-12-29: qty 0.5

## 2020-12-29 MED ORDER — LACTATED RINGERS IV SOLN
500.0000 mL | INTRAVENOUS | Status: DC | PRN
Start: 2020-12-29 — End: 2020-12-29
  Administered 2020-12-29 (×2): 250 mL via INTRAVENOUS

## 2020-12-29 MED ORDER — IBUPROFEN 600 MG PO TABS
600.0000 mg | ORAL_TABLET | Freq: Four times a day (QID) | ORAL | Status: DC
  Administered 2020-12-29 – 2020-12-31 (×7): 600 mg via ORAL
  Filled 2020-12-29 (×8): qty 1

## 2020-12-29 MED ORDER — TERBUTALINE SULFATE 1 MG/ML IJ SOLN: 0.2500 mg | Freq: Once | INTRAMUSCULAR | Status: DC | PRN

## 2020-12-29 MED ORDER — LIDOCAINE HCL (PF) 1 % IJ SOLN
INTRAMUSCULAR | Status: DC | PRN
  Administered 2020-12-29: 3 mL via SUBCUTANEOUS

## 2020-12-29 MED ORDER — WITCH HAZEL-GLYCERIN EX PADS
1.0000 "application " | MEDICATED_PAD | CUTANEOUS | Status: DC | PRN
  Administered 2020-12-31: 1 via TOPICAL
  Filled 2020-12-29 (×3): qty 100

## 2020-12-29 MED ORDER — LACTATED RINGERS IV SOLN: INTRAVENOUS | Status: DC

## 2020-12-29 MED ORDER — MISOPROSTOL 200 MCG PO TABS
ORAL_TABLET | ORAL | Status: AC
  Filled 2020-12-29: qty 4

## 2020-12-29 MED ORDER — SIMETHICONE 80 MG PO CHEW: 80.0000 mg | CHEWABLE_TABLET | ORAL | Status: DC | PRN

## 2020-12-29 MED ORDER — SOD CITRATE-CITRIC ACID 500-334 MG/5ML PO SOLN: 30.0000 mL | ORAL | Status: DC | PRN

## 2020-12-29 MED ORDER — LIDOCAINE-EPINEPHRINE (PF) 1.5 %-1:200000 IJ SOLN
INTRAMUSCULAR | Status: DC | PRN
  Administered 2020-12-29: 4 mL via EPIDURAL

## 2020-12-29 MED ORDER — FENTANYL 2.5 MCG/ML W/ROPIVACAINE 0.15% IN NS 100 ML EPIDURAL (ARMC)
EPIDURAL | Status: AC
  Filled 2020-12-29: qty 100

## 2020-12-29 MED ORDER — FENTANYL 2.5 MCG/ML W/ROPIVACAINE 0.15% IN NS 100 ML EPIDURAL (ARMC)
EPIDURAL | Status: DC | PRN
  Administered 2020-12-29: 12 mL/h via EPIDURAL

## 2020-12-29 MED ORDER — OXYCODONE-ACETAMINOPHEN 5-325 MG PO TABS: 2.0000 | ORAL_TABLET | ORAL | Status: DC | PRN

## 2020-12-29 MED ORDER — ZOLPIDEM TARTRATE 5 MG PO TABS: 5.0000 mg | ORAL_TABLET | Freq: Every evening | ORAL | Status: DC | PRN

## 2020-12-29 NOTE — Discharge Summary (Signed)
Postpartum Discharge Summary  Date of Service updated: 12/31/2020     Patient Name: Teresa [name] DOB: 08/11/2000 MRN: [id number]  Date of admission: 12/29/2020 Delivery date:12/29/2020  Delivering provider: Gae Dry  Date of discharge: 12/31/2020  Admitting diagnosis: Encounter for elective induction of labor [Z34.90] Intrauterine pregnancy: [redacted]w[redacted]d    Secondary diagnosis:  Active Problems:   Encounter for elective induction of labor   [redacted] weeks gestation of pregnancy   Third degree perineal laceration during delivery with less than 50% tear of external anal sphincter   Encounter for care or examination of lactating mother   Postpartum care following vaginal delivery  Additional problems: none    Discharge diagnosis: Term Pregnancy Delivered                                              Post partum procedures: none Augmentation: Pitocin and Cytotec Complications: None  Hospital course: Induction of Labor With Vaginal Delivery   20y.o. yo G1P0000 at 359w3das admitted to the hospital 12/29/2020 for induction of labor.  Indication for induction: Favorable cervix at term.  Patient had an uncomplicated labor course as follows: Membrane Rupture Time/Date: 6:15 AM ,12/29/2020   Delivery Method:Vaginal, Vacuum (Extractor)  Episiotomy: None  Lacerations:  3rd degree  Details of delivery can be found in separate delivery note.    Patient had a routine postpartum course. She is tolerating regular diet. Her pain is controlled with PO medications. She is ambulating and voiding without difficulty. She is taking stool softener and knows to avoid straining with BM. She reports breastfeeding is going well.  Patient is discharged home 12/31/20.  Newborn Data: Birth date:12/29/2020  Birth time:2:33 PM  Gender:Female  Living status:Living  Apgars:8 ,9  Weight:3860 g   Magnesium Sulfate received: No BMZ received: No Rhophylac:No MMR:No T-DaP:Given postpartum Flu:  No Transfusion:No  Physical exam  Vitals:   12/30/20 2342 12/30/20 2347 12/31/20 0035 12/31/20 0758  BP: (!) 79/66 (!) 89/47 (!) 100/57 111/76  Pulse: (!) 105 (!) 106  99  Resp:  18  20  Temp:  100.2 F (37.9 C) 99.5 F (37.5 C) 98.6 F (37 C)  TempSrc:  Oral  Oral  SpO2:  100%  100%  Weight:      Height:       General: alert, cooperative and no distress Lochia: appropriate Uterine Fundus: firm Incision: N/A DVT Evaluation: No evidence of DVT seen on physical exam. Labs: Lab Results  Component Value Date   WBC 13.4 (H) 12/30/2020   HGB 9.5 (L) 12/30/2020   HCT 27.6 (L) 12/30/2020   MCV 91.4 12/30/2020   PLT 137 (L) 12/30/2020   CMP Latest Ref Rng & Units 10/16/2020  Glucose 70 - 99 mg/dL 84  BUN 6 - 20 mg/dL 10  Creatinine 0.44 - 1.00 mg/dL 0.43(L)  Sodium 135 - 145 mmol/L 135  Potassium 3.5 - 5.1 mmol/L 3.6  Chloride 98 - 111 mmol/L 105  CO2 22 - 32 mmol/L 23  Calcium 8.9 - 10.3 mg/dL 8.7(L)  Total Protein 6.5 - 8.1 g/dL 6.6  Total Bilirubin 0.3 - 1.2 mg/dL 0.6  Alkaline Phos 38 - 126 U/L 68  AST 15 - 41 U/L 15  ALT 0 - 44 U/L 14   Edinburgh Score: Edinburgh Postnatal Depression Scale Screening Tool 12/30/2020  I have  been able to laugh and see the funny side of things. 0  I have looked forward with enjoyment to things. 1  I have blamed myself unnecessarily when things went wrong. 1  I have been anxious or worried for no good reason. 2  I have felt scared or panicky for no good reason. 1  Things have been getting on top of me. 1  I have been so unhappy that I have had difficulty sleeping. 0  I have felt sad or miserable. 0  I have been so unhappy that I have been crying. 0  The thought of harming myself has occurred to me. 0  Edinburgh Postnatal Depression Scale Total 6      After visit meds:  Allergies as of 12/31/2020   No Known Allergies     Medication List    TAKE these medications   Prenatal Vitamins 28-0.8 MG Tabs Take 1 tablet by mouth  daily.        Discharge home in stable condition Infant Feeding: Breast Infant Disposition:home with mother Discharge instruction: per After Visit Summary and Postpartum booklet. Activity: Advance as tolerated. Pelvic rest for 6 weeks.  Diet: routine diet Anticipated Birth Control: Condoms Postpartum Appointment:6 weeks Additional Postpartum F/U: 6 weeks and sooner if needed Future Appointments:No future appointments. Follow up Visit:  Follow-up Information    Gae Dry, MD. Schedule an appointment as soon as possible for a visit in 6 week(s).   Specialty: Obstetrics and Gynecology Why: For a post partum check and sooner as needed for concerns Contact information: [address] [place] [address] [phone]                12/31/2020 Rod Can, CNM

## 2020-12-29 NOTE — H&P (Signed)
Obstetric H&P   Chief Complaint: Encounter for elective induction of labor  Prenatal Care Provider: [facility]  History of Present Illness: 20 y.o. G1P0000 48w3dby 01/02/2021, by Ultrasound presenting to L&D for elective induction of labor.  +FM, no LOF, no VB, no ctx.   Pregnancy has been uncomplicated to date, GBS negative.    Pregravid weight 60.3 kg Total Weight Gain 27.2 kg  Pregnancy#1 Problems (from 03/22/20 to present)    Problem Noted Resolved   Supervision of low-risk first pregnancy 05/14/2020 by JWill Bonnet MD No   Overview Addendum 12/23/2020  3:39 PM by FImagene Riches CNM     Nursing Staff Provider  Office Location  [facility] Dating   7wk [place] Language  English Anatomy [place] complete  Flu Vaccine   declined Genetic Screen  NIPS: normal xx  TDaP vaccine   12/09/20 Hgb A1C or  GTT Early : n/a Third trimester : 113  Covid  J&J   LAB RESULTS   Rhogam  Not needed Blood Type O/Positive/-- (10/06 1430)   Feeding Plan  Breast Antibody Negative (10/06 1430)  Contraception None/condoms Rubella 1.11 (10/06 1430)  Circumcision NA RPR Non Reactive (10/06 1430)   Pediatrician   HBsAg Negative (10/06 1430)   Support Person  Partner- Cesar HIV Non Reactive (10/06 1430)  Prenatal Classes  Discussed Varicella  Immune    GBS  (For PCN allergy, check sensitivities) negative  BTL Consent  n/a    VBAC Consent  n/a Pap  under 21    Hgb Electro      CF      SMA                Previous Version       Review of Systems: 10 point review of systems negative unless otherwise noted in HPI  Past Medical History: Patient Active Problem List   Diagnosis Date Noted  . Encounter for elective induction of labor 12/29/2020  . Labor and delivery indication for care or intervention 12/27/2020  . Supervision of low-risk first pregnancy 05/14/2020     Nursing Staff Provider  Office Location  [facility] Dating   7wk [place] Language  English Anatomy [place] complete  Flu Vaccine   declined  Genetic Screen  NIPS: normal xx  TDaP vaccine   12/09/20 Hgb A1C or  GTT Early : n/a Third trimester : 113  Covid  J&J   LAB RESULTS   Rhogam  Not needed Blood Type O/Positive/-- (10/06 1430)   Feeding Plan  Breast Antibody Negative (10/06 1430)  Contraception None/condoms Rubella 1.11 (10/06 1430)  Circumcision NA RPR Non Reactive (10/06 1430)   Pediatrician   HBsAg Negative (10/06 1430)   Support Person  Partner- Cesar HIV Non Reactive (10/06 1430)  Prenatal Classes  Discussed Varicella  Immune    GBS  (For PCN allergy, check sensitivities) negative  BTL Consent  n/a    VBAC Consent  n/a Pap  under 21    Hgb Electro      CF      SMA             . Generalized anxiety disorder 02/12/2020  . Depression with anxiety 02/12/2020  . History of suicide attempt 06/01/2016  . MDD (major depressive disorder), recurrent episode, moderate ([place] 05/31/2016  . History of ankle sprain 09/23/2015  . AD (atopic dermatitis) 05/12/2015  . Elevated TSH 05/12/2015  . History of chlamydia infection 05/12/2015  Past Surgical History: Past Surgical History:  Procedure Laterality Date  . DENTAL SURGERY  2019   Dental implant    Past Obstetric History: # 1 - Date: None, Sex: None, Weight: None, GA: None, Delivery: None, Apgar1: None, Apgar5: None, Living: None, Birth Comments: None   Past Gynecologic History:  Family History: Family History  Problem Relation Age of Onset  . Obesity Mother     Social History: Social History   Socioeconomic History  . Marital status: Single    Spouse name: Not on file  . Number of children: Not on file  . Years of education: Not on file  . Highest education level: Not on file  Occupational History  . Not on file  Tobacco Use  . Smoking status: Former Smoker    Types: Cigarettes  . Smokeless tobacco: Never Used  Vaping Use  . Vaping Use: Former  Substance and Sexual Activity  . Alcohol use: No    Alcohol/week: 0.0 standard drinks  . Drug  use: Not Currently    Frequency: 7.0 times per week    Types: Marijuana    Comment: smoking   . Sexual activity: Yes    Partners: Male    Birth control/protection: Condom  Other Topics Concern  . Not on file  Social History Narrative  . Not on file   Social Determinants of Health   Financial Resource Strain: Not on file  Food Insecurity: Not on file  Transportation Needs: Not on file  Physical Activity: Not on file  Stress: Not on file  Social Connections: Not on file  Intimate Partner Violence: Not on file    Medications: Prior to Admission medications   Medication Sig Start Date End Date Taking? Authorizing Provider  Prenatal Vit-Fe Fumarate-FA (PRENATAL VITAMINS) 28-0.8 MG TABS Take 1 tablet by mouth daily.   Yes [provider]    Allergies: No Known Allergies  Physical Exam: Vitals: Blood pressure 117/70, pulse 86, temperature 98.1 F (36.7 C), temperature source Oral, resp. rate 16, last menstrual period 03/22/2020.  Urine Dip Protein: N/A  FHT: 135, moderate,+accel's, no decels Toco: none  General: NAD HEENT: normocephalic, anicteric Pulmonary: No increased work of breathing Cardiovascular: RRR, distal pulses 2+ Abdomen: Gravid, non-tender Leopolds: vtx Genitourinary: 2cm Extremities: no edema, erythema, or tenderness Neurologic: Grossly intact Psychiatric: mood appropriate, affect full  Labs: No results found for this or any previous visit (from the past 24 hour(s)).  Assessment: 20 y.o. G1P0000 21w3dby 01/02/2021, by Ultrasound presenting for elective induction of labor  Plan: 1) IOL - The ARRIVE study was a national multicenter trial that randomized 6,106 patients to induction of labor at 39 weeks 0 days to 39 weeks 4 days (3,062) compared to expectant management (3,044).  There was no significant difference in adverse perinatal outcomes but there was a significantly lower rate of cesarean delivery, as well as lower rate of maternal  hypertensive complications in the induction group.  Number to treat was calculated as 28 induction of labor to prevent 1 primary Cesarean section.  "Labor Induction versus Expectant Management in Nulliparous Low-Risk Women" The [employer] Medicine iAugust 9, 2018 Vol. 379 No. 6  - patient opts to proceed with induction of labor  2) Fetus - cat I tracing  3) PNL - Blood type --/--/O POS [facility] [address] [place] [id number] ([phone]) / Anti-bodyscreen NEG (03/10 2152) / Rubella 1.11 (10/06 1430) / Varicella Immune / RPR  Non Reactive (03/04 1524) / HBsAg Negative (10/06 1430) / HIV Non Reactive (03/04 1524) / 1-hr OGTT 113 / GBS Negative/-- (05/03 1420)  4) Immunization History -  Immunization History  Administered Date(s) Administered  . DTaP 08/30/2000, 10/28/2000, 01/20/2001, 10/20/2001  . HPV Quadrivalent 09/11/2014, 11/15/2014, 03/20/2015  . Hepatitis B 07/28/2000, 08/30/2000, 01/20/2001  . HiB (PRP-OMP) 08/30/2000, 10/28/2000, 01/20/2001, 07/03/2001  . IPV 08/30/2000, 10/28/2000, 07/03/2001  . Janssen (J&J) SARS-COV-2 Vaccination 10/26/2019  . MMR 07/03/2001, 03/30/2011  . Moderna Sars-Covid-2 Vaccination 05/23/2020  . Tdap 03/30/2011, 12/09/2020    5) Disposition - pending delivery  Malachy Mood, MD, [place], [facility] 12/29/2020, 1:01 AM

## 2020-12-29 NOTE — Progress Notes (Signed)
Teresa [name] is a 20 y.o. G1P0000 at [redacted]w[redacted]d by ultrasound admitted for induction of labor due to Elective at term.  Subjective: She received on dose of Cytotec after being admitted and was checked earlier at 0830 and found to be 8 cms. Now being rechecked after positioning with the peanut ball. Very comfortable with her epidural.  Objective: BP (!) 98/49 (BP Location: Right Arm)   Pulse 69   Temp 98.4 F (36.9 C) (Oral)   Resp 15   Ht 5\' 6"  (1.676 m)   Wt 87.5 kg   LMP 03/22/2020   SpO2 98%   BMI 31.15 kg/m  No intake/output data recorded. No intake/output data recorded.   FHT:  FHR: 125 baseline bpm, variability: moderate,  accelerations:  Present,  decelerations:  Absent UC:   regular, every  2-3.17minutes SVE:   Dilation: 10 Effacement (%): 100 Station: 0 Exam by:: Rolly Salter, CNM  Slightly narrow outlet noted with exam  Labs: Lab Results  Component Value Date   WBC 9.6 12/29/2020   HGB 10.4 (L) 12/29/2020   HCT 29.6 (L) 12/29/2020   MCV 90.0 12/29/2020   PLT 168 12/29/2020    Assessment / Plan: Induction of labor due to term with favorable cervix,  Now completely dilated. Epidural anesthesia  Labor: Progressing normally  Fetal Wellbeing:  Category I Pain Control:  Epidural I/D:  n/a Anticipated MOD:  NSVD   Will place in high Fowlers for one hour to encourage descent, then begin pushing.  Imagene Riches 12/29/2020, 10:35 AM

## 2020-12-29 NOTE — Anesthesia Preprocedure Evaluation (Signed)
Anesthesia Evaluation  Patient identified by MRN, date of birth, ID band Patient awake    Reviewed: Allergy & Precautions, H&P , NPO status , Patient's Chart, lab work & pertinent test results  History of Anesthesia Complications Negative for: history of anesthetic complications  Airway Mallampati: II       Dental no notable dental hx.    Pulmonary former smoker,           Cardiovascular      Neuro/Psych PSYCHIATRIC DISORDERS Anxiety Depression    GI/Hepatic   Endo/Other    Renal/GU      Musculoskeletal   Abdominal   Peds  Hematology   Anesthesia Other Findings   Reproductive/Obstetrics (+) Pregnancy                             Anesthesia Physical Anesthesia Plan  ASA: II  Anesthesia Plan: Epidural   Post-op Pain Management:    Induction:   PONV Risk Score and Plan:   Airway Management Planned:   Additional Equipment:   Intra-op Plan:   Post-operative Plan:   Informed Consent: I have reviewed the patients History and Physical, chart, labs and discussed the procedure including the risks, benefits and alternatives for the proposed anesthesia with the patient or authorized representative who has indicated his/her understanding and acceptance.       Plan Discussed with: Anesthesiologist  Anesthesia Plan Comments:         Anesthesia Quick Evaluation

## 2020-12-29 NOTE — Anesthesia Procedure Notes (Signed)
Epidural Patient location during procedure: OB Start time: 12/29/2020 7:15 AM End time: 12/29/2020 7:22 AM  Staffing Anesthesiologist: Gunnar Fusi, MD Resident/CRNA: Aline Brochure, CRNA Performed: resident/CRNA   Preanesthetic Checklist Completed: patient identified, IV checked, site marked, risks and benefits discussed, surgical consent, monitors and equipment checked, pre-op evaluation and timeout performed  Epidural Patient position: sitting Prep: ChloraPrep Patient monitoring: heart rate, continuous pulse ox and blood pressure Approach: midline Location: L3-L4 Injection technique: LOR saline  Needle:  Needle type: Tuohy  Needle gauge: 17 G Needle length: 9 cm and 9 Needle insertion depth: 6 cm Catheter type: closed end flexible Catheter size: 19 Gauge Catheter at skin depth: 12 cm Test dose: negative and 1.5% lidocaine with Epi 1:200 K  Assessment Sensory level: T10 Events: blood not aspirated, injection not painful, no injection resistance, no paresthesia and negative IV test  Additional Notes 1 attempt Pt. Evaluated and documentation done after procedure finished. Patient identified. Risks/Benefits/Options discussed with patient including but not limited to bleeding, infection, nerve damage, paralysis, failed block, incomplete pain control, headache, blood pressure changes, nausea, vomiting, reactions to medication both or allergic, itching and postpartum back pain. Confirmed with bedside nurse the patient's most recent platelet count. Confirmed with patient that they are not currently taking any anticoagulation, have any bleeding history or any family history of bleeding disorders. Patient expressed understanding and wished to proceed. All questions were answered. Sterile technique was used throughout the entire procedure. Please see nursing notes for vital signs. Test dose was given through epidural catheter and negative prior to continuing to dose epidural or  start infusion. Warning signs of high block given to the patient including shortness of breath, tingling/numbness in hands, complete motor block, or any concerning symptoms with instructions to call for help. Patient was given instructions on fall risk and not to get out of bed. All questions and concerns addressed with instructions to call with any issues or inadequate analgesia.   Patient tolerated the insertion well without immediate complications.Reason for block:procedure for pain

## 2020-12-30 ENCOUNTER — Encounter: Payer: Self-pay | Admitting: Obstetrics & Gynecology

## 2020-12-30 LAB — CBC
HCT: 27.6 % — ABNORMAL LOW (ref 36.0–46.0)
Hemoglobin: 9.5 g/dL — ABNORMAL LOW (ref 12.0–15.0)
MCH: 31.5 pg (ref 26.0–34.0)
MCHC: 34.4 g/dL (ref 30.0–36.0)
MCV: 91.4 fL (ref 80.0–100.0)
Platelets: 137 10*3/uL — ABNORMAL LOW (ref 150–400)
RBC: 3.02 MIL/uL — ABNORMAL LOW (ref 3.87–5.11)
RDW: 13.1 % (ref 11.5–15.5)
WBC: 13.4 10*3/uL — ABNORMAL HIGH (ref 4.0–10.5)
nRBC: 0 % (ref 0.0–0.2)

## 2020-12-30 NOTE — Anesthesia Postprocedure Evaluation (Signed)
Anesthesia Post Note  Patient: Teresa [name]  Procedure(s) Performed: AN AD HOC LABOR EPIDURAL  Patient location during evaluation: Mother Baby Anesthesia Type: Epidural Level of consciousness: awake and alert and oriented Pain management: pain level controlled Vital Signs Assessment: post-procedure vital signs reviewed and stable Respiratory status: respiratory function stable Cardiovascular status: stable Postop Assessment: no backache, no headache, patient able to bend at knees, adequate PO intake, able to ambulate and no apparent nausea or vomiting Anesthetic complications: no   No complications documented.   Last Vitals:  Vitals:   12/30/20 0359 12/30/20 0753  BP: 112/76 103/71  Pulse: 70 71  Resp: 20   Temp: 36.6 C 36.4 C  SpO2: 99% 99%    Last Pain:  Vitals:   12/30/20 0753  TempSrc: Oral  PainSc:                  [name]

## 2020-12-30 NOTE — Lactation Note (Signed)
This note was copied from a baby's chart. Lactation Consultation Note  Patient Name: Teresa [name] [id number] Date: 12/30/2020 Reason for consult: Follow-up assessment;Primapara;Term Age:21 hours   Lactation at bedside to assist with feeding attempt.  LC supported mom with good positioning and pillow support for her back. Baby unwrapped from swaddle and brought to mom skin to skin in cross-cradle hold.  Initially baby was licking at breast with no attempts to latch- however LC easily hand expressed colostrum onto tip of nipple and baby opened wide, and latched easily. Strong rhythmic sucking pattern, mom notes strong but not painful, swallows identified. [place] kept baby awake through stimulation and after 11 minutes baby let go of breast on her own. LC brought baby skin to skin on mom's chest where she was resting comfortably, non-cuing, and asleep. LC reviewed early cues, importance of feeding on demand, and hand expression to help encourage a deep latch.  Maternal Data Has patient been taught Hand Expression?: Yes Does the patient have breastfeeding experience prior to this delivery?: No  Feeding Mother's Current Feeding Choice: Breast Milk  LATCH Score Latch: Grasps breast easily, tongue down, lips flanged, rhythmical sucking.  Audible Swallowing: A few with stimulation  Type of Nipple: Everted at rest and after stimulation  Comfort (Breast/Nipple): Soft / non-tender  Hold (Positioning): Assistance needed to correctly position infant at breast and maintain latch.  LATCH Score: 8   Lactation Tools Discussed/Used    Interventions Interventions: Breast feeding basics reviewed;Assisted with latch;Hand express;Adjust position;Support pillows;Position options;Education  Discharge    Consult Status Consult Status: Follow-up Date: 12/30/20 Follow-up type: Call as needed    Lavonia Drafts 12/30/2020, 1:50 PM

## 2020-12-30 NOTE — Progress Notes (Signed)
Pt noted to have some residual urine after voiding early in the shift around 8pm, I&O cath x 1 per order resulted in 800cc of urine output. CNM Rolly Salter notified of I&O cath during her rounds around 10pm and received verbal orders to leave foley in place if pt requires another I&O cath. Pt has voided multiple times since I&O cath but uterine placement is 1/u-2/u. Repeat bladder scan yielded 75cc with pt feeling like she emptied after voiding. CNM notified again and I received orders to recheck Fundus and Post void residual in 2 hours (6am). At approximately 6am I went to reassess per orders and pt declined. Stating "I really dont want to do that right now, I just stopped cramping, please come back later." Notified CNM Rolly Salter, she states she will notify oncoming physician.

## 2020-12-30 NOTE — Progress Notes (Signed)
Admit Date: 12/29/2020 Today's Date: 12/30/2020  Post Partum Day 1 after VAVD  Subjective:  no complaints, up ad lib, tolerating PO and now voiding without difficulty  Objective: Temp:  [97.6 F (36.4 C)-99.5 F (37.5 C)] 97.6 F (36.4 C) (05/24 0753) Pulse Rate:  [58-96] 71 (05/24 0753) Resp:  [18-20] 20 (05/24 0359) BP: (98-126)/(49-88) 103/71 (05/24 0753) SpO2:  [98 %-100 %] 99 % (05/24 0753)  Physical Exam:  General: alert, cooperative and no distress Lochia: appropriate Uterine Fundus: firm Incision: none DVT Evaluation: No evidence of DVT seen on physical exam. Negative Homan's sign. No cords or calf tenderness.  Recent Labs    12/29/20 0050 12/30/20 0457  HGB 10.4* 9.5*  HCT 29.6* 27.6*    Assessment/Plan: Plan for discharge tomorrow, Breastfeeding and Infant doing well  No hormonal contraception desired   LOS: 1 day   [place] 12/30/2020, 8:18 AM

## 2020-12-30 NOTE — Lactation Note (Signed)
This note was copied from a baby's chart. Lactation Consultation Note  Patient Name: Teresa [name] [id number] Date: 12/30/2020 Reason for consult: Initial assessment;Primapara;Term Age:21 hours  Initial lactation visit. Mom is G1P1, SVD 19 hours ago. This is mom's first baby and plans to fully breastfeed at this time. Baby has several feeds logged and adequate void/stools recorded.  Mom was asleep with baby skin to skin, LC calmly woke mom to move baby to bassinet. During conversation, mom feels that baby is feeding well, some slightly strong sucking feelings while baby is feeding, and mom reports baby preferring one side over the other. LC reviewed tips and strategies for getting baby to be comfortable with both breasts. Reviewed newborn stomach size, feeding behaviors, early cues, and feeding on demand.  Mom extremely sleepy, baby sleeping in bassinet, LC provided warm blanket for baby, and encouraged mom to call with next feeding attempt.  Maternal Data Has patient been taught Hand Expression?: Yes Does the patient have breastfeeding experience prior to this delivery?: No  Feeding Mother's Current Feeding Choice: Breast Milk  LATCH Score Latch:  (baby sleeping)                  Lactation Tools Discussed/Used    Interventions Interventions: Breast feeding basics reviewed;Education;Hand express  Discharge    Consult Status Consult Status: Follow-up Date: 12/30/20 Follow-up type: In-patient    Lavonia Drafts 12/30/2020, 10:29 AM

## 2020-12-30 NOTE — Discharge Instructions (Signed)
Discharge Instructions:   Follow-up Appointment:  If there are any new medications, they have been ordered and will be available for pickup at the listed pharmacy on your way home from the hospital.   Call office if you have any of the following: headache, visual changes, fever >101.0 F, chills, shortness of breath, breast concerns, excessive vaginal bleeding, incision drainage or problems, leg pain or redness, depression or any other concerns. If you have vaginal discharge with an odor, let your doctor know.   It is normal to bleed for up to 6 weeks. You should not soak through more than 1 pad in 1 hour. If you have a blood clot larger than your fist with continued bleeding, call your doctor.   Activity: Do not lift > 10 lbs for 6 weeks (do not lift anything heavier than your baby). No intercourse, tampons, swimming pools, hot tubs, baths (only showers) for 6 weeks.  No driving for 1-2 weeks. Continue prenatal vitamin, especially if breastfeeding. Increase calories and fluids (water) while breastfeeding.   Your milk will come in, in the next couple of days (right now it is colostrum). You may have a slight fever when your milk comes in, but it should go away on its own.  If it does not, and rises above 101 F please call the doctor. You will also feel achy and your breasts will be firm. They will also start to leak. If you are breastfeeding, continue as you have been and you can pump/express milk for comfort.   If you have too much milk, your breasts can become engorged, which could lead to mastitis. This is an infection of the milk ducts. It can be very painful and you will need to notify your doctor to obtain a prescription for antibiotics. You can also treat it with a shower or hot/cold compress.   For concerns about your baby, please call your pediatrician.  For breastfeeding concerns, the lactation consultant can be reached at [phone].   Postpartum blues (feelings of happy one minute  and sad another minute) are normal for the first few weeks but if it gets worse let your doctor know.   Congratulations! We enjoyed caring for you and your new bundle of joy!   Postpartum Care After Vaginal Delivery The following information offers guidance about how to care for yourself from the time you deliver your baby to 6-12 weeks after delivery (postpartum period). If you have problems or questions, contact your health care provider for more specific instructions. Follow these instructions at home: Vaginal bleeding  It is normal to have vaginal bleeding (lochia) after delivery. Wear a sanitary pad for bleeding and discharge. ? During the first week after delivery, the amount and appearance of lochia is often similar to a menstrual period. ? Over the next few weeks, it will gradually decrease to a dry, yellow-brown discharge. ? For most women, lochia stops completely by 4-6 weeks after delivery, but can vary.  Change your sanitary pads frequently. Watch for any changes in your flow, such as: ? A sudden increase in volume. ? A change in color. ? Large blood clots.  If you pass a blood clot from your vagina, save it and call your health care provider. Do not flush blood clots down the toilet before talking with your health care provider.  Do not use tampons or douches until your health care provider approves.  If you are not breastfeeding, your period should return 6-8 weeks after delivery. If you are  feeding your baby breast milk only, your period may not return until you stop breastfeeding. Perineal care  Keep the area between the vagina and the anus (perineum) clean and dry. Use medicated pads and pain-relieving sprays and creams as directed.  If you had a surgical cut in the perineum (episiotomy) or a tear, check the area for signs of infection until you are healed. Check for: ? More redness, swelling, or pain. ? Fluid or blood coming from the cut or tear. ? Warmth. ? Pus or a  bad smell.  You may be given a squirt bottle to use instead of wiping to clean the perineum area after you use the bathroom. Pat the area gently to dry it.  To relieve pain caused by an episiotomy, a tear, or swollen veins in the anus (hemorrhoids), take a warm sitz bath 2-3 times a day. In a sitz bath, the warm water should only come up to your hips and cover your buttocks.   Breast care  In the first few days after delivery, your breasts may feel heavy, full, and uncomfortable (breast engorgement). Milk may also leak from your breasts. Ask your health care provider about ways to help relieve the discomfort.  If you are breastfeeding: ? Wear a bra that supports your breasts and fits well. Use breast pads to absorb milk that leaks. ? Keep your nipples clean and dry. Apply creams and ointments as told. ? You may have uterine contractions every time you breastfeed for up to several weeks after delivery. This helps your uterus return to its normal size. ? If you have any problems with breastfeeding, notify your health care provider or lactation consultant.  If you are not breastfeeding: ? Avoid touching your breasts. Do not squeeze out (express) milk. Doing this can make your breasts produce more milk. ? Wear a good-fitting bra and use cold packs to help with swelling. Intimacy and sexuality  Ask your health care provider when you can engage in sexual activity. This may depend upon: ? Your risk of infection. ? How fast you are healing. ? Your comfort and desire to engage in sexual activity.  You are able to get pregnant after delivery, even if you have not had your period. Talk with your health care provider about methods of birth control (contraception) or family planning if you desire future pregnancies. Medicines  Take over-the-counter and prescription medicines only as told by your health care provider.  Take an over-the-counter stool softener to help ease bowel movements as told by  your health care provider.  If you were prescribed an antibiotic medicine, take it as told by your health care provider. Do not stop taking the antibiotic even if you start to feel better.  Review all previous and current prescriptions to check for possible transfer into breast milk. Activity  Gradually return to your normal activities as told by your health care provider.  Rest as much as possible. Nap while your baby is sleeping. Eating and drinking  Drink enough fluid to keep your urine pale yellow.  To help prevent or relieve constipation, eat high-fiber foods every day.  Choose healthy eating to support breastfeeding or weight loss goals.  Take your prenatal vitamins until your health care provider tells you to stop.   General tips/recommendations  Do not use any products that contain nicotine or tobacco. These products include cigarettes, chewing tobacco, and vaping devices, such as e-cigarettes. If you need help quitting, ask your health care provider.  Do not  drink alcohol, especially if you are breastfeeding.  Do not take medications or drugs that are not prescribed to you, especially if you are breastfeeding.  Visit your health care provider for a postpartum checkup within the first 3-6 weeks after delivery.  Complete a comprehensive postpartum visit no later than 12 weeks after delivery.  Keep all follow-up visits for you and your baby. Contact a health care provider if:  You feel unusually sad or worried.  Your breasts become red, painful, or hard.  You have a fever or other signs of an infection.  You have bleeding that is soaking through one pad an hour or you have blood clots.  You have a severe headache that doesn't go away or you have vision changes.  You have nausea and vomiting and are unable to eat or drink anything for 24 hours. Get help right away if:  You have chest pain or difficulty breathing.  You have sudden, severe leg pain.  You faint or  have a seizure.  You have thoughts about hurting yourself or your baby. If you ever feel like you may hurt yourself or others, or have thoughts about taking your own life, get help right away. Go to your nearest emergency department or:  Call your local emergency services (911 in the [place]  The National Suicide Prevention Lifeline at 516-692-0824. This suicide crisis helpline is open 24 hours a day.  Text the Crisis Text Line at 302-801-1956 (in the [facility].). Summary  The period of time after you deliver your newborn up to 6-12 weeks after delivery is called the postpartum period.  Keep all follow-up visits for you and your baby.  Review all previous and current prescriptions to check for possible transfer into breast milk.  Contact a health care provider if you feel unusually sad or worried during the postpartum period. This information is not intended to replace advice given to you by your health care provider. Make sure you discuss any questions you have with your health care provider. Document Revised: 04/10/2020 Document Reviewed: 04/10/2020 Elsevier Patient Education  2021 [employer].

## 2020-12-31 ENCOUNTER — Emergency Department: Payer: Medicaid Other

## 2020-12-31 ENCOUNTER — Other Ambulatory Visit: Payer: Self-pay

## 2020-12-31 ENCOUNTER — Emergency Department
Admission: EM | Admit: 2020-12-31 | Discharge: 2020-12-31 | Disposition: A | Discharge status: Home / Self Care | Payer: Medicaid Other | Attending: Emergency Medicine | Admitting: Emergency Medicine

## 2020-12-31 DIAGNOSIS — Z20822 Contact with and (suspected) exposure to covid-19: Secondary | ICD-10-CM | POA: Insufficient documentation

## 2020-12-31 DIAGNOSIS — R509 Fever, unspecified: Secondary | ICD-10-CM

## 2020-12-31 DIAGNOSIS — N1330 Unspecified hydronephrosis: Secondary | ICD-10-CM | POA: Diagnosis not present

## 2020-12-31 DIAGNOSIS — O99891 Other specified diseases and conditions complicating pregnancy: Secondary | ICD-10-CM | POA: Diagnosis not present

## 2020-12-31 DIAGNOSIS — R519 Headache, unspecified: Secondary | ICD-10-CM | POA: Insufficient documentation

## 2020-12-31 DIAGNOSIS — R109 Unspecified abdominal pain: Secondary | ICD-10-CM

## 2020-12-31 DIAGNOSIS — N134 Hydroureter: Secondary | ICD-10-CM | POA: Insufficient documentation

## 2020-12-31 DIAGNOSIS — Z87891 Personal history of nicotine dependence: Secondary | ICD-10-CM | POA: Insufficient documentation

## 2020-12-31 LAB — COMPREHENSIVE METABOLIC PANEL
ALT: 15 U/L (ref 0–44)
AST: 24 U/L (ref 15–41)
Albumin: 2.8 g/dL — ABNORMAL LOW (ref 3.5–5.0)
Alkaline Phosphatase: 118 U/L (ref 38–126)
Anion gap: 11 (ref 5–15)
BUN: 11 mg/dL (ref 6–20)
CO2: 19 mmol/L — ABNORMAL LOW (ref 22–32)
Calcium: 8.1 mg/dL — ABNORMAL LOW (ref 8.9–10.3)
Chloride: 103 mmol/L (ref 98–111)
Creatinine, Ser: 0.74 mg/dL (ref 0.44–1.00)
GFR, Estimated: >60 mL/min (ref >60)
Glucose, Bld: 110 mg/dL — ABNORMAL HIGH (ref 70–99)
Potassium: 3.3 mmol/L — ABNORMAL LOW (ref 3.5–5.1)
Sodium: 133 mmol/L — ABNORMAL LOW (ref 135–145)
Total Bilirubin: 0.6 mg/dL (ref 0.3–1.2)
Total Protein: 6.4 g/dL — ABNORMAL LOW (ref 6.5–8.1)

## 2020-12-31 LAB — RESP PANEL BY RT-PCR (FLU A&B, COVID) ARPGX2
Influenza A by PCR: NEGATIVE
Influenza B by PCR: NEGATIVE
SARS Coronavirus 2 by RT PCR: NEGATIVE

## 2020-12-31 LAB — CBC WITH DIFFERENTIAL/PLATELET
Abs Immature Granulocytes: 0.18 10*3/uL — ABNORMAL HIGH (ref 0.00–0.07)
Basophils Absolute: 0 10*3/uL (ref 0.0–0.1)
Basophils Relative: 0 %
Eosinophils Absolute: 0 10*3/uL (ref 0.0–0.5)
Eosinophils Relative: 0 %
HCT: 28.2 % — ABNORMAL LOW (ref 36.0–46.0)
Hemoglobin: 9.6 g/dL — ABNORMAL LOW (ref 12.0–15.0)
Immature Granulocytes: 2 %
Lymphocytes Relative: 6 %
Lymphs Abs: 0.7 10*3/uL (ref 0.7–4.0)
MCH: 30.9 pg (ref 26.0–34.0)
MCHC: 34 g/dL (ref 30.0–36.0)
MCV: 90.7 fL (ref 80.0–100.0)
Monocytes Absolute: 0.6 10*3/uL (ref 0.1–1.0)
Monocytes Relative: 5 %
Neutro Abs: 10.5 10*3/uL — ABNORMAL HIGH (ref 1.7–7.7)
Neutrophils Relative %: 87 %
Platelets: 154 10*3/uL (ref 150–400)
RBC: 3.11 MIL/uL — ABNORMAL LOW (ref 3.87–5.11)
RDW: 13.4 % (ref 11.5–15.5)
WBC: 12 10*3/uL — ABNORMAL HIGH (ref 4.0–10.5)
nRBC: 0 % (ref 0.0–0.2)

## 2020-12-31 LAB — URINALYSIS, COMPLETE (UACMP) WITH MICROSCOPIC
Bacteria, UA: NONE SEEN
Bilirubin Urine: NEGATIVE
Glucose, UA: NEGATIVE mg/dL
Ketones, ur: NEGATIVE mg/dL
Nitrite: NEGATIVE
Protein, ur: 30 mg/dL — AB
RBC / HPF: >50 RBC/hpf — ABNORMAL HIGH (ref 0–5)
Specific Gravity, Urine: 1.015 (ref 1.005–1.030)
pH: 9 — ABNORMAL HIGH (ref 5.0–8.0)

## 2020-12-31 LAB — PREGNANCY, URINE: Preg Test, Ur: POSITIVE — AB

## 2020-12-31 LAB — LACTIC ACID, PLASMA: Lactic Acid, Venous: 1.3 mmol/L (ref 0.5–1.9)

## 2020-12-31 MED ORDER — AMOXICILLIN-POT CLAVULANATE 875-125 MG PO TABS
1.0000 | ORAL_TABLET | Freq: Once | ORAL | Status: AC
  Administered 2020-12-31: 1 via ORAL
  Filled 2020-12-31: qty 1

## 2020-12-31 MED ORDER — SODIUM CHLORIDE 0.9 % IV BOLUS
1000.0000 mL | Freq: Once | INTRAVENOUS | Status: AC
  Administered 2020-12-31: 1000 mL via INTRAVENOUS

## 2020-12-31 MED ORDER — AMOXICILLIN-POT CLAVULANATE 875-125 MG PO TABS: 1.0000 | ORAL_TABLET | Freq: Two times a day (BID) | ORAL | 0 refills | Status: AC

## 2020-12-31 MED ORDER — IBUPROFEN 600 MG PO TABS
600.0000 mg | ORAL_TABLET | Freq: Once | ORAL | Status: AC
Start: 2020-12-31 — End: 2020-12-31
  Administered 2020-12-31: 600 mg via ORAL
  Filled 2020-12-31: qty 1

## 2020-12-31 NOTE — ED Provider Notes (Signed)
[facility] [place] Emergency Department Provider Note   ____________________________________________   Event Date/Time   First MD Initiated Contact with Patient 12/31/20 2023     (approximate)  I have reviewed the triage vital signs and the nursing notes.   HISTORY  Chief Complaint Fever and Postpartum Complications    HPI Teresa [name] is a 20 y.o. female that is 2 days postpartum after an uncomplicated vaginal delivery with epidural in place who presents for right-sided flank pain with fever and headache.  Patient states that she was discharged this morning from our hospital with a low-grade fever of 99 and was given ibuprofen prior to her temperature going down and told that it was [facility] for discharge.  Patient states that since going home she has felt increasingly worse as well as higher temperatures including 101F.  Patient endorses normal lochia without vaginal pain.  Patient currently denies any vision changes, tinnitus, difficulty speaking, facial droop, sore throat, chest pain, shortness of breath, nausea/vomiting/diarrhea, dysuria, or weakness/numbness/paresthesias in any extremity         Past Medical History:  Diagnosis Date  . Elevated TSH   . History of chicken pox   . Mass of left ovary   . Moderate depressive episode [facility])     Patient Active Problem List   Diagnosis Date Noted  . Encounter for care or examination of lactating mother 12/31/2020  . Postpartum care following vaginal delivery 12/31/2020  . Encounter for elective induction of labor 12/29/2020  . [redacted] weeks gestation of pregnancy 12/29/2020  . Third degree perineal laceration during delivery with less than 50% tear of external anal sphincter 12/29/2020  . Supervision of low-risk first pregnancy 05/14/2020  . Generalized anxiety disorder 02/12/2020  . Depression with anxiety 02/12/2020  . History of suicide attempt 06/01/2016  . MDD (major depressive disorder), recurrent  episode, moderate ([place]) 05/31/2016  . History of ankle sprain 09/23/2015  . AD (atopic dermatitis) 05/12/2015  . Elevated TSH 05/12/2015  . History of chlamydia infection 05/12/2015    Past Surgical History:  Procedure Laterality Date  . DENTAL SURGERY  2019   Dental implant    Prior to Admission medications   Medication Sig Start Date End Date Taking? Authorizing Provider  amoxicillin-clavulanate (AUGMENTIN) 875-125 MG tablet Take 1 tablet by mouth 2 (two) times daily for 3 days. 12/31/20 01/03/21 Yes Naaman Plummer, MD  Prenatal Vit-Fe Fumarate-FA (PRENATAL VITAMINS) 28-0.8 MG TABS Take 1 tablet by mouth daily.    [provider]    Allergies Patient has no known allergies.  Family History  Problem Relation Age of Onset  . Obesity Mother     Social History Social History   Tobacco Use  . Smoking status: Former Smoker    Types: Cigarettes    Quit date: 2021    Years since quitting: 1.3  . Smokeless tobacco: Never Used  Vaping Use  . Vaping Use: Former  Substance Use Topics  . Alcohol use: No    Alcohol/week: 0.0 standard drinks  . Drug use: Not Currently    Frequency: 7.0 times per week    Types: Marijuana    Comment: smoking     Review of Systems Constitutional: No fever/chills Eyes: No visual changes. ENT: No sore throat. Cardiovascular: Denies chest pain. Respiratory: Denies shortness of breath. Gastrointestinal: Endorses right flank pain.  No nausea, no vomiting.  No diarrhea. Genitourinary: Negative for dysuria. Musculoskeletal: Negative for acute arthralgias Skin: Negative for rash. Neurological: Positive for  headaches, negative for weakness/numbness/paresthesias in any extremity Psychiatric: Negative for suicidal ideation/homicidal ideation   ____________________________________________   PHYSICAL EXAM:  VITAL SIGNS: ED Triage Vitals  Enc Vitals Group     BP 12/31/20 1849 112/79     Pulse Rate 12/31/20 1849 (!) 124     Resp  12/31/20 1849 17     Temp 12/31/20 1849 100.2 F (37.9 C)     Temp Source 12/31/20 1849 Oral     SpO2 12/31/20 1849 100 %     Weight 12/31/20 1850 170 lb (77.1 kg)     Height 12/31/20 1850 5\' 6"  (1.676 m)     Head Circumference --      Peak Flow --      Pain Score 12/31/20 1849 6     Pain Loc --      Pain Edu? --      Excl. in [place]? --    Constitutional: Alert and oriented. Well appearing overweight Caucasian female in no acute distress laying in the left lateral decubitus position Eyes: Conjunctivae are normal. PERRL. Head: Atraumatic. Nose: No congestion/rhinnorhea. Mouth/Throat: Mucous membranes are moist. Neck: No stridor Cardiovascular: Grossly normal heart sounds.  Good peripheral circulation. Respiratory: Normal respiratory effort.  No retractions. Gastrointestinal: Right CVA tenderness to percussion.  Soft and nontender. No distention. Musculoskeletal: No obvious deformities Neurologic:  Normal speech and language. No gross focal neurologic deficits are appreciated. Skin:  Skin is warm and dry. No rash noted. Psychiatric: Mood and affect are normal. Speech and behavior are normal.  ____________________________________________   LABS (all labs ordered are listed, but only abnormal results are displayed)  Labs Reviewed  COMPREHENSIVE METABOLIC PANEL - Abnormal; Notable for the following components:      Result Value   Sodium 133 (*)    Potassium 3.3 (*)    CO2 19 (*)    Glucose, Bld 110 (*)    Calcium 8.1 (*)    Total Protein 6.4 (*)    Albumin 2.8 (*)    All other components within normal limits  CBC WITH DIFFERENTIAL/PLATELET - Abnormal; Notable for the following components:   WBC 12.0 (*)    RBC 3.11 (*)    Hemoglobin 9.6 (*)    HCT 28.2 (*)    Neutro Abs 10.5 (*)    Abs Immature Granulocytes 0.18 (*)    All other components within normal limits  URINALYSIS, COMPLETE (UACMP) WITH MICROSCOPIC - Abnormal; Notable for the following components:   Color, Urine  YELLOW (*)    APPearance HAZY (*)    pH 9.0 (*)    Hgb urine dipstick LARGE (*)    Protein, ur 30 (*)    Leukocytes,Ua SMALL (*)    RBC / HPF >50 (*)    All other components within normal limits  PREGNANCY, URINE - Abnormal; Notable for the following components:   Preg Test, Ur POSITIVE (*)    All other components within normal limits  RESP PANEL BY RT-PCR (FLU A&B, COVID) ARPGX2  LACTIC ACID, PLASMA   RADIOLOGY  ED MD interpretation: CT of the abdomen and pelvis without contrast shows nonobstructing calculus in the interpolar region of the right kidney.  There is mild bilateral hydroureter nephrosis that is worse on the right than the left.  Also evidence of a enlarged uterus with myometrial thickening as well as conspicuous hyperdensity seen within the endometrial canal concerning for hemorrhagic products versus retained products of conception  Official radiology report(s): CT Renal Stone Study  Addendum Date: 12/31/2020   ADDENDUM REPORT: 12/31/2020 21:56 ADDENDUM: Additional impression point: Nonobstructing calculus in the interpolar right kidney. Initial results and addendum were called by telephone at the time of physician contact on 12/31/2020 at 9:55 pm to provider [facility] , who verbally acknowledged these results. Electronically Signed   By: Lovena Le M.D.   On: 12/31/2020 21:56   Result Date: 12/31/2020 CLINICAL DATA:  Low back pain, fever, 2 days postpartum EXAM: CT ABDOMEN AND PELVIS WITHOUT CONTRAST TECHNIQUE: Multidetector CT imaging of the abdomen and pelvis was performed following the standard protocol without IV contrast. COMPARISON:  Obstetrical ultrasound 08/19/2020, abdominal ultrasound 11/27/2020 FINDINGS: Lower chest: Lung bases are clear. Normal heart size. No pericardial effusion. Hepatobiliary: No worrisome focal liver abnormality is seen. Normal gallbladder. No visible calcified gallstones. No biliary ductal dilatation. Gallbladder partially decompressed at  time of exam. No gross gallbladder abnormality. No visible calcified gallstones or biliary ductal dilatation. Pancreas: No pancreatic ductal dilatation or surrounding inflammatory changes. Spleen: Splenomegaly. Adrenals/Urinary Tract: Normal adrenal glands. Kidneys are symmetric in size and normally located. No visible or contour deforming renal lesion. Mild right perinephric stranding. Mild bilateral hydroureteronephrosis to the level of the bladder. Question some mild urothelial thickening towards the right renal pelvis as well (2/40). Urinary bladder is fairly unremarkable for the degree of distention. No visible bladder calculi or debris. Stomach/Bowel: Distal esophagus, stomach and duodenum are unremarkable. No small bowel thickening or dilatation. Normal air-filled appendix in the right lower quadrant. Moderate colonic stool burden. Vascular/Lymphatic: No pathologically enlarged nodes are seen in the abdomen or pelvis within the limitation of this unenhanced exam. Lack of contrast media also limits evaluation of the vasculature without gross acute vascular abnormality, aneurysm or ectasia. Reproductive: There is an enlarged, retroflexed uterus, some of which is expected and may persist up to 8 weeks postpartum. However, there is more hyperdense material seen within the endometrial canal which is concerning for acute to subacute hemorrhagic products on this unenhanced CT examination. Maximal thickness of 15 mm. No concerning adnexal lesions. Other: No abdominopelvic free fluid or free gas. No bowel containing hernias. Small fat containing umbilical. Musculoskeletal: No acute osseous abnormality or suspicious osseous lesion. Bone island seen anterior superior corner L5. IMPRESSION: Enlarged uterus with myometrial thickening, nonspecific given the recent postpartum state, such finding can persist to 8 weeks following delivery. More conspicuous hyperdensity seen within the endometrial canal on this noncontrast CT  examination could reflect acute or subacute hemorrhagic products or possible retained products of conception. Recommend further characterization pelvic ultrasound to include color Doppler assessment. Mild bilateral hydroureteronephrosis with some moderate bladder distention. Some questionable urothelial thickening of the right renal pelvis and faint right perinephric stranding is noted. Can reflect incompletely resolved ventral hydronephrosis pregnancy. However, recommend correlation with urinalysis to exclude an ascending tract infection. Currently attempting to contact the ordering provider with a critical value result. Addendum will be submitted upon case discussion. Electronically Signed: By: Lovena Le M.D. On: 12/31/2020 21:50    ____________________________________________   PROCEDURES  Procedure(s) performed (including Critical Care):  Procedures   ____________________________________________   INITIAL IMPRESSION / ASSESSMENT AND PLAN / ED COURSE  As part of my medical decision making, I reviewed the following data within the [facility] notes reviewed and incorporated, Labs reviewed, EKG interpreted, Old chart reviewed, Radiograph reviewed and Notes from prior ED visits reviewed and incorporated        Patients symptoms not typical for emergent causes of abdominal  pain such as, but not limited to, appendicitis, abdominal aortic aneurysm, surgical biliary disease, pancreatitis, SBO, mesenteric ischemia, serious intra-abdominal bacterial illness.  Given bilateral periureteral stranding and hydroureteronephrosis, concern for possible passage of a kidney stone. Presentation also not typical of gynecologic emergencies such as TOA, Ovarian Torsion, PID. Not Ectopic.  Given no increase in vaginal bleeding, low concern for retained products of conception. Doubt atypical ACS. Consults: I spoke to Dr. Georgianne Fick and OB/GYN to discuss these results of the CT scan.  He  also states that it would be relatively rare for patient to have retained products of conception without increase in vaginal bleeding.  Patient once again denies any heavy vaginal bleeding after her delivery.  He did recommend however a empiric Augmentin regimen for possibility of endometritis.  Will provide this prescription for the patient. Pt tolerating PO and pain is well controlled at this time.  Encouraged to continue ibuprofen and Tylenol use at home for any continued pain. Disposition: Patient will be discharged with strict return precautions and follow up with primary MD within 12-24 hours for further evaluation. Patient understands that this still may have an early presentation of an emergent medical condition such as appendicitis that will require a recheck.      ____________________________________________   FINAL CLINICAL IMPRESSION(S) / ED DIAGNOSES  Final diagnoses:  Right flank pain  Fever, unspecified fever cause  Hydroureter on right     ED Discharge Orders         Ordered    amoxicillin-clavulanate (AUGMENTIN) 875-125 MG tablet  2 times daily        12/31/20 2219           Note:  This document was prepared using Dragon voice recognition software and may include unintentional dictation errors.   Naaman Plummer, MD 12/31/20 [phone]

## 2020-12-31 NOTE — ED Notes (Signed)
Pt had baby on Monday. Uncomplicated delivery except for third degree tear. Pt began having low grade fever today with some right sided flank pain.

## 2020-12-31 NOTE — ED Triage Notes (Signed)
C/O low back pain and fever.  Patient post partum 2 days, discharged this morning from [facility]. Patient states she had a low grade fever at discharge, was given Ibuprofen and temp went down so OK to discharge.  States fever has returned and back pain worsened.  Patient had a epidural.  AAOx3.  Skin warm and dry.  Tearful. Anxious. NAD

## 2020-12-31 NOTE — ED Triage Notes (Signed)
Pt reports temp 104 at home

## 2020-12-31 NOTE — Progress Notes (Signed)
Patient discharged home with infant. Discharge instructions, prescriptions, and follow-up appointment were given to and reviewed with patient. Patient verbalized understanding. Escorted out by auxiliary.

## 2020-12-31 NOTE — Lactation Note (Signed)
This note was copied from a baby's chart. Lactation Consultation Note  Patient Name: Teresa [name] [id number]'S Date: 12/31/2020 Reason for consult: Follow-up assessment;Primapara;Term Age:21 hours  Lactation follow-up prior to anticipated discharge. Baby had feedings overnight and several wet/stool diapers. Mom is feeling a little tender/sore this morning, and has been given lanolin (hospital out of coconut oil), and comfort gels.  Baby active at breast this morning when LC entered, audible swallows. Mom reports feeling more comfortable, baby accepting other breast equally without any problems. LC reviewed positioning, latch, alignment, output expectations, growth spurts/cluster feeding, and feeding with early cues. Encouraged the delay of artifical nipples until breastfeeding well established and how early introduction can interfere with milk production, and latch.  [place] educated mom on anticipated breast changes, breast fullness and engorgement and management of both, as well as nipple care, and use of comfort gels for soreness.  Outpatient lactation information and community breastfeeding support given. Encouraged to call with questions/concerns and for ongoing BF support.  Maternal Data Has patient been taught Hand Expression?: Yes Does the patient have breastfeeding experience prior to this delivery?: No  Feeding Mother's Current Feeding Choice: Breast Milk  LATCH Score Latch: Grasps breast easily, tongue down, lips flanged, rhythmical sucking.  Audible Swallowing: Spontaneous and intermittent  Type of Nipple: Everted at rest and after stimulation  Comfort (Breast/Nipple): Filling, red/small blisters or bruises, mild/mod discomfort (some tenderness)  Hold (Positioning): No assistance needed to correctly position infant at breast.  LATCH Score: 9   Lactation Tools Discussed/Used    Interventions Interventions: Breast feeding basics reviewed;Hand express;Position  options;Support pillows;Education;Comfort gels  Discharge Discharge Education: Engorgement and breast care;Warning signs for feeding baby;Outpatient recommendation  Consult Status Consult Status: Complete Date: 12/31/20 Follow-up type: Call as needed    Lavonia Drafts 12/31/2020, 9:07 AM

## 2020-12-31 NOTE — ED Notes (Signed)
Pt given mesh underwear and maternity pads per request to change them.

## 2020-12-31 NOTE — Progress Notes (Signed)
   12/30/20 2347  Vital Signs  BP (!) 89/47  Pulse Rate (!) 106  Resp 18  Temp 100.2 F (37.9 C)  Temp Source Oral  Oxygen Therapy  SpO2 100 %  Tylenol 650mg  PO given for temp 100.2, BP as above, pt states its WNL for her. Pt asymptomatic. Will recheck VS in an hour

## 2021-01-01 ENCOUNTER — Telehealth: Payer: Self-pay | Admitting: *Deleted

## 2021-01-01 NOTE — Telephone Encounter (Signed)
Transition Care Management Unsuccessful Follow-up Telephone Call  Date of discharge and from where:  12/31/2020 [facility]  Attempts:  1st Attempt  Reason for unsuccessful TCM follow-up call:  Left voice message

## 2021-01-02 NOTE — Telephone Encounter (Signed)
Transition Care Management Unsuccessful Follow-up Telephone Call  Date of discharge and from where:  12/31/2020 from [facility]  Attempts:  2nd Attempt  Reason for unsuccessful TCM follow-up call:  Left voice message

## 2021-01-06 NOTE — Telephone Encounter (Signed)
Transition Care Management Follow-up Telephone Call  Date of discharge and from where: 12/31/2020 from [facility]  How have you been since you were released from the hospital? Pt stated that she is feeling much better and does not have any questions or concerns.   Any questions or concerns? No  Items Reviewed:  Did the pt receive and understand the discharge instructions provided? Yes   Medications obtained and verified? Yes   Other? No   Any new allergies since your discharge? No   Dietary orders reviewed? n/a  Do you have support at home? Yes   Functional Questionnaire: (I = Independent and D = Dependent) ADLs: I  Bathing/Dressing- I  Meal Prep- I  Eating- I  Maintaining continence- I  Transferring/Ambulation- I  Managing Meds- I   Follow up appointments reviewed:   PCP Hospital f/u appt confirmed? No    Specialist Hospital f/u appt confirmed? No    Are transportation arrangements needed? No   If their condition worsens, is the pt aware to call PCP or go to the Emergency Dept.? Yes  Was the patient provided with contact information for the PCP's office or ED? Yes  Was to pt encouraged to call back with questions or concerns? Yes

## 2021-01-13 ENCOUNTER — Encounter: Payer: Self-pay | Admitting: Advanced Practice Midwife

## 2021-01-13 ENCOUNTER — Ambulatory Visit (INDEPENDENT_AMBULATORY_CARE_PROVIDER_SITE_OTHER): Payer: Medicaid Other | Admitting: Advanced Practice Midwife

## 2021-01-13 ENCOUNTER — Other Ambulatory Visit: Payer: Self-pay

## 2021-01-13 DIAGNOSIS — S3141XA Laceration without foreign body of vagina and vulva, initial encounter: Secondary | ICD-10-CM

## 2021-01-13 DIAGNOSIS — S3141XS Laceration without foreign body of vagina and vulva, sequela: Secondary | ICD-10-CM

## 2021-01-15 NOTE — Progress Notes (Signed)
Patient ID: Teresa [name], female   DOB: 10-Jun-2001, 20 y.o.   MRN: [id number]  Reason for Consult: Vaginal Pain    Subjective:  Date of Service: 01/15/2021  HPI:  Teresa [name] is a 20 y.o. female being seen for worsening perineal pain following vacuum assisted vaginal delivery on 12/29/2020 with repair of partial 3rd degree laceration. She reports difficulty sitting due to the pain. She denies an increase in vaginal bleeding, fever, abdominal pain, or evidence of infection. She has not yet taken any sitz baths. I had recommended at delivery discharge time that she wait a few days prior to starting sea salt tub soaks given the 3rd degree laceration. She reports breastfeeding is going well overall as she is adjusting to life with a newborn.  I asked Dr Gilman Schmidt to evaluate the laceration as I was unsure if it would need a revision. Per Dr Gilman Schmidt there is no sign of infection, no revision is needed at this time, start sitz baths and follow up with Dr Kenton Kingfisher in 1 week.  Past Medical History:  Diagnosis Date   Elevated TSH    History of chicken pox    Mass of left ovary    Moderate depressive episode (HCC)    Family History  Problem Relation Age of Onset   Obesity Mother    Past Surgical History:  Procedure Laterality Date   DENTAL SURGERY  2019   Dental implant    Short Social History:  Social History   Tobacco Use   Smoking status: Former    Pack years: 0.00    Types: Cigarettes    Quit date: 2021    Years since quitting: 1.4   Smokeless tobacco: Never  Substance Use Topics   Alcohol use: No    Alcohol/week: 0.0 standard drinks    No Known Allergies  Current Outpatient Medications  Medication Sig Dispense Refill   Prenatal Vit-Fe Fumarate-FA (PRENATAL VITAMINS) 28-0.8 MG TABS Take 1 tablet by mouth daily.     No current facility-administered medications for this visit.    Review of Systems  Constitutional:  Negative for chills and fever.  HENT:  Negative for  congestion, ear discharge, ear pain, hearing loss, sinus pain and sore throat.   Eyes:  Negative for blurred vision and double vision.  Respiratory:  Negative for cough, shortness of breath and wheezing.   Cardiovascular:  Negative for chest pain, palpitations and leg swelling.  Gastrointestinal:  Negative for abdominal pain, blood in stool, constipation, diarrhea, heartburn, melena, nausea and vomiting.  Genitourinary:  Negative for dysuria, flank pain, frequency, hematuria and urgency.       Positive for perineal pain  Musculoskeletal:  Negative for back pain, joint pain and myalgias.  Skin:  Negative for itching and rash.  Neurological:  Negative for dizziness, tingling, tremors, sensory change, speech change, focal weakness, seizures, loss of consciousness, weakness and headaches.  Endo/Heme/Allergies:  Negative for environmental allergies. Does not bruise/bleed easily.  Psychiatric/Behavioral:  Negative for depression, hallucinations, memory loss, substance abuse and suicidal ideas. The patient is not nervous/anxious and does not have insomnia.        Objective:  Objective   Vitals:   01/13/21 0843  BP: 100/60  Weight: 170 lb (77.1 kg)  Height: 5\' 6"  (1.676 m)   Body mass index is 27.44 kg/m. Constitutional: Well nourished, well developed female in no acute distress.  HEENT: normal Skin: Warm and dry.  Respiratory: Clear to auscultation bilateral. Normal respiratory effort Neuro:  DTRs 2+, Cranial nerves grossly intact Psych: Alert and Oriented x3. No memory deficits. Normal mood and affect.  MS: normal gait, normal bilateral lower extremity ROM/strength/stability.  Pelvic exam: (female chaperone present) is not limited by body habitus EGBUS: within normal limits Vagina: 2-3 mm gap in perineal laceration with stitches visible/remainder of exam by Dr Gilman Schmidt- suturing intact    Assessment/Plan:     20 y.o. G1 P30 female with perineal pain at site of healing  laceration  Daily sea salt sitz baths Follow up in 1 week with Dr Alfonse Spruce CNM [facility] 01/15/2021, 11:51 AM

## 2021-01-21 ENCOUNTER — Ambulatory Visit: Payer: Medicaid Other | Admitting: Obstetrics & Gynecology

## 2021-01-23 ENCOUNTER — Ambulatory Visit (INDEPENDENT_AMBULATORY_CARE_PROVIDER_SITE_OTHER): Payer: Medicaid Other | Admitting: Obstetrics & Gynecology

## 2021-01-23 ENCOUNTER — Encounter: Payer: Self-pay | Admitting: Obstetrics & Gynecology

## 2021-01-23 ENCOUNTER — Other Ambulatory Visit: Payer: Self-pay

## 2021-01-23 VITALS — BP 100/60 | Ht 65.0 in | Wt 168.0 lb

## 2021-01-23 DIAGNOSIS — N200 Calculus of kidney: Secondary | ICD-10-CM | POA: Diagnosis not present

## 2021-01-23 DIAGNOSIS — S3141XA Laceration without foreign body of vagina and vulva, initial encounter: Secondary | ICD-10-CM | POA: Diagnosis not present

## 2021-01-23 DIAGNOSIS — S3141XS Laceration without foreign body of vagina and vulva, sequela: Secondary | ICD-10-CM

## 2021-01-23 NOTE — Progress Notes (Signed)
HPI:  Patient is a 20 y.o. G1P1001 presenting for follow up evaluation of her 3rd degree (partial, <50% sphincter) 3 weeks ago.  Reports pain and discomfort there.  Also seen in ER and dx w kidney stones, still having pain.  No referral was made then for follow up.  PMHx: She  has a past medical history of Elevated TSH, History of chicken pox, Mass of left ovary, and Moderate depressive episode ([place]). Also,  has a past surgical history that includes Dental surgery (2019)., family history includes Obesity in her mother.,  reports that she quit smoking about 17 months ago. Her smoking use included cigarettes. She has never used smokeless tobacco. She reports previous drug use. Frequency: 7.00 times per week. Drug: Marijuana. She reports that she does not drink alcohol.  She has a current medication list which includes the following prescription(s): prenatal vitamins. Also, has No Known Allergies.  Review of Systems  All other systems reviewed and are negative.  Objective: BP 100/60   Ht 5\' 5"  (1.651 m)   Wt 168 lb (76.2 kg)   BMI 27.96 kg/m  Filed Weights   01/23/21 1136  Weight: 168 lb (76.2 kg)   Body mass index is 27.96 kg/m.  Physical examination Physical Exam Constitutional:      General: She is not in acute distress.    Appearance: She is well-developed.  Genitourinary:     Right Labia: No rash or tenderness.    Left Labia: No tenderness or rash.    Vulva exam comments: 3rd degree repair healing, with some visible suture still present  Rectal exam normal, w good sphincter tone and no rectal component.     No vaginal erythema or bleeding.      Right Adnexa: not tender and no mass present.    Left Adnexa: not tender and no mass present.    No cervical motion tenderness, discharge, polyp or nabothian cyst.     Uterus is not enlarged.     No uterine mass detected.    Pelvic exam was performed with patient in the lithotomy position.  HENT:     Head: Normocephalic and  atraumatic.     Nose: Nose normal.  Abdominal:     General: There is no distension.     Palpations: Abdomen is soft.     Tenderness: There is no abdominal tenderness.  Musculoskeletal:        General: Normal range of motion.  Neurological:     Mental Status: She is alert and oriented to person, place, and time.     Cranial Nerves: No cranial nerve deficit.  Skin:    General: Skin is warm and dry.  Psychiatric:        Attention and Perception: Attention normal.        Mood and Affect: Mood and affect normal.        Speech: Speech normal.        Behavior: Behavior normal.        Thought Content: Thought content normal.        Judgment: Judgment normal.    ASSESSMENT:    ICD-10-CM   1. Vaginal laceration, sequela  S31.41XS     2. Kidney stone  N20.0 Ambulatory referral to Urology      Plan: Monitor healing Sitz Colace Pelvic abstinance Recheck Consdier revision if does not hel 100% by 6 weeks  A total of 20 minutes were spent face-to-face with the patient as well as preparation, review, communication, and  documentation during this encounter.    Barnett Applebaum, MD, [facility], [facility] 01/23/2021  11:51 AM

## 2021-01-27 ENCOUNTER — Other Ambulatory Visit: Payer: Self-pay

## 2021-01-27 ENCOUNTER — Ambulatory Visit (INDEPENDENT_AMBULATORY_CARE_PROVIDER_SITE_OTHER): Payer: Medicaid Other | Admitting: Urology

## 2021-01-27 ENCOUNTER — Encounter: Payer: Self-pay | Admitting: Urology

## 2021-01-27 VITALS — BP 105/70 | HR 108 | Ht 65.0 in | Wt 170.0 lb

## 2021-01-27 DIAGNOSIS — N1330 Unspecified hydronephrosis: Secondary | ICD-10-CM

## 2021-01-27 DIAGNOSIS — N9089 Other specified noninflammatory disorders of vulva and perineum: Secondary | ICD-10-CM

## 2021-01-27 DIAGNOSIS — N200 Calculus of kidney: Secondary | ICD-10-CM | POA: Diagnosis not present

## 2021-01-27 DIAGNOSIS — R109 Unspecified abdominal pain: Secondary | ICD-10-CM | POA: Diagnosis not present

## 2021-01-27 LAB — URINALYSIS, COMPLETE
Bilirubin, UA: NEGATIVE
Glucose, UA: NEGATIVE
Ketones, UA: NEGATIVE
Nitrite, UA: NEGATIVE
Protein,UA: NEGATIVE
Specific Gravity, UA: 1.02 (ref 1.005–1.030)
Urobilinogen, Ur: 0.2 mg/dL (ref 0.2–1.0)
pH, UA: 5.5 (ref 5.0–7.5)

## 2021-01-27 LAB — MICROSCOPIC EXAMINATION: Bacteria, UA: NONE SEEN

## 2021-01-27 LAB — BLADDER SCAN AMB NON-IMAGING: Scan Result: 16

## 2021-01-27 MED ORDER — CLOTRIMAZOLE-BETAMETHASONE 1-0.05 % EX CREA: 1.0000 "application " | TOPICAL_CREAM | Freq: Two times a day (BID) | CUTANEOUS | 0 refills | Status: DC

## 2021-01-27 NOTE — Progress Notes (Signed)
01/27/2021 4:52 PM   Teresa [name] [birth year] [id number]  Referring provider: Gae Dry, MD [address] [place],  [place] [address]  Chief Complaint  Patient presents with   Nephrolithiasis    HPI: 20 year old female who presents today for further evaluation of kidney stone.  She was seen and evaluated on 12/31/2020 in the emergency room with some mild right flank pain as well as fevers.  She was seen in the emergency room only 2 days postpartum.  Urinalysis appeared likely contaminated at the time, greater than 50 red blood cells, 21-50 white blood cells, no bacteria and multiple squamous epithelial cells.  Her uterus remained enlarged and she had some persistent mild bilateral hydronephrosis on that scan.  Suddenly, punctate right interpolar calculus was identified.  There is also some questionable urothelial thickening of the right renal pelvis and some faint perinephric stranding.  She was prescribed Augmentin as a precaution primarily for the possibility of endometritis.  Today, she denies any ongoing flank pain or fevers.  She does describe severe discomfort in the area situated between her clitoris and urethra which feels very uncomfortable, like constant pressure and burning in this area.  It is unrelated to voiding.    This is her first pregnancy/delivery.   PMH: Past Medical History:  Diagnosis Date   Elevated TSH    History of chicken pox    Mass of left ovary    Moderate depressive episode [facility])     Surgical History: Past Surgical History:  Procedure Laterality Date   DENTAL SURGERY  2019   Dental implant    Home Medications:  Allergies as of 01/27/2021   No Known Allergies      Medication List        Accurate as of January 27, 2021  4:52 PM. If you have any questions, ask your nurse or doctor.          STOP taking these medications    Prenatal Vitamins 28-0.8 MG Tabs Stopped by: Hollice Espy, MD        Allergies: No Known  Allergies  Family History: Family History  Problem Relation Age of Onset   Obesity Mother     Social History:  reports that she quit smoking about 17 months ago. Her smoking use included cigarettes. She has never used smokeless tobacco. She reports previous drug use. Frequency: 7.00 times per week. Drug: Marijuana. She reports that she does not drink alcohol.   Physical Exam: BP 105/70   Pulse (!) 108   Ht 5\' 5"  (1.651 m)   Wt 170 lb (77.1 kg)   BMI 28.29 kg/m   Constitutional:  Alert and oriented, No acute distress. HEENT: [place] AT, moist mucus membranes.  Trachea midline, no masses. Cardiovascular: No clubbing, cyanosis, or edema. Respiratory: Normal respiratory effort, no increased work of breathing. Pelvic exam: Chaperoned by Daryel Gerald, CMA.  External exam only performed today, there is slight erythema/excoriation of her vulva anteriorly just superior to her urethra but inferior to her clitoris and the location she describes as uncomfortable Skin: No rashes, bruises or suspicious lesions. Neurologic: Grossly intact, no focal deficits, moving all 4 extremities. Psychiatric: Normal mood and affect.  Laboratory Data: Lab Results  Component Value Date   WBC 12.0 (H) 12/31/2020   HGB 9.6 (L) 12/31/2020   HCT 28.2 (L) 12/31/2020   MCV 90.7 12/31/2020   PLT 154 12/31/2020    Lab Results  Component Value Date   CREATININE 0.74 12/31/2020  Lab Results  Component Value Date   HGBA1C 4.8 06/02/2016    Urinalysis Urinalysis today with a few red blood cells and white blood cells but no bacteria nitrate negative   Pertinent Imaging: CT Renal Stone Study  Addendum 12/31/2020  9:58 PM ADDENDUM REPORT: 12/31/2020 21:56  ADDENDUM: Additional impression point:  Nonobstructing calculus in the interpolar right kidney.  Initial results and addendum were called by telephone at the time of physician contact on 12/31/2020 at 9:55 pm to provider [facility] , who verbally  acknowledged these results.   Electronically Signed By: Lovena Le M.D. On: 12/31/2020 21:56  Narrative CLINICAL DATA:  Low back pain, fever, 2 days postpartum  EXAM: CT ABDOMEN AND PELVIS WITHOUT CONTRAST  TECHNIQUE: Multidetector CT imaging of the abdomen and pelvis was performed following the standard protocol without IV contrast.  COMPARISON:  Obstetrical ultrasound 08/19/2020, abdominal ultrasound 11/27/2020  FINDINGS: Lower chest: Lung bases are clear. Normal heart size. No pericardial effusion.  Hepatobiliary: No worrisome focal liver abnormality is seen. Normal gallbladder. No visible calcified gallstones. No biliary ductal dilatation. Gallbladder partially decompressed at time of exam. No gross gallbladder abnormality. No visible calcified gallstones or biliary ductal dilatation.  Pancreas: No pancreatic ductal dilatation or surrounding inflammatory changes.  Spleen: Splenomegaly.  Adrenals/Urinary Tract: Normal adrenal glands. Kidneys are symmetric in size and normally located. No visible or contour deforming renal lesion. Mild right perinephric stranding. Mild bilateral hydroureteronephrosis to the level of the bladder. Question some mild urothelial thickening towards the right renal pelvis as well (2/40). Urinary bladder is fairly unremarkable for the degree of distention. No visible bladder calculi or debris.  Stomach/Bowel: Distal esophagus, stomach and duodenum are unremarkable. No small bowel thickening or dilatation. Normal air-filled appendix in the right lower quadrant. Moderate colonic stool burden.  Vascular/Lymphatic: No pathologically enlarged nodes are seen in the abdomen or pelvis within the limitation of this unenhanced exam. Lack of contrast media also limits evaluation of the vasculature without gross acute vascular abnormality, aneurysm or ectasia.  Reproductive: There is an enlarged, retroflexed uterus, some of which is expected  and may persist up to 8 weeks postpartum. However, there is more hyperdense material seen within the endometrial canal which is concerning for acute to subacute hemorrhagic products on this unenhanced CT examination. Maximal thickness of 15 mm. No concerning adnexal lesions.  Other: No abdominopelvic free fluid or free gas. No bowel containing hernias. Small fat containing umbilical.  Musculoskeletal: No acute osseous abnormality or suspicious osseous lesion. Bone island seen anterior superior corner L5.  IMPRESSION: Enlarged uterus with myometrial thickening, nonspecific given the recent postpartum state, such finding can persist to 8 weeks following delivery.  More conspicuous hyperdensity seen within the endometrial canal on this noncontrast CT examination could reflect acute or subacute hemorrhagic products or possible retained products of conception. Recommend further characterization pelvic ultrasound to include color Doppler assessment.  Mild bilateral hydroureteronephrosis with some moderate bladder distention. Some questionable urothelial thickening of the right renal pelvis and faint right perinephric stranding is noted. Can reflect incompletely resolved ventral hydronephrosis pregnancy. However, recommend correlation with urinalysis to exclude an ascending tract infection.  Currently attempting to contact the ordering provider with a critical value result. Addendum will be submitted upon case discussion.  Electronically Signed: By: Lovena Le M.D. On: 12/31/2020 21:50  CT stone protocol imaging was personally reviewed today.  Agree with the above radiologic interpretation.    Assessment & Plan:    1. Kidney stones Punctate right  midpole stone  We discussed today that this is likely a red herring and a completely incidental finding.  Do not believe that her presentation was related to the stone.  We did discuss stone prevention primarily increasing  hydration.  Would not recommend any intervention or further surveillance for this tiny stone.  - Urinalysis, Complete - Bladder Scan (Post Void Residual) in office - CULTURE, URINE COMPREHENSIVE  2. Bilateral hydronephrosis Bilateral hydronephrosis, mild on postpartum day 2 which is physiologic and likely has resolved   3. Right flank pain There was some mild inflammatory changes around her right kidney possibly with some urothelial thickening suggestive that she may have in fact had a low-grade or mild pyelonephritis which explains her symptoms  Urinalysis appeared contaminated in the immediate postpartum state is unclear whether or not there was an true infection.  No culture was sent.  That being said, she was started on antibiotics which likely treated this and her flank pain is resolved.  As above, stone is likely unrelated.  4. Labial irritation Exam today shows some vulvar irritation in the area she describes as being the source of her discomfort  Reach out to Dr. Kenton Kingfisher who recommends Lotrisone cream    F/u prn  Hollice Espy, MD  [facility] [address], [place], [place] [address] [phone]

## 2021-01-29 LAB — CULTURE, URINE COMPREHENSIVE

## 2021-02-12 ENCOUNTER — Encounter: Payer: Self-pay | Admitting: Obstetrics & Gynecology

## 2021-02-12 ENCOUNTER — Ambulatory Visit (INDEPENDENT_AMBULATORY_CARE_PROVIDER_SITE_OTHER): Payer: Medicaid Other | Admitting: Obstetrics & Gynecology

## 2021-02-12 ENCOUNTER — Other Ambulatory Visit: Payer: Self-pay

## 2021-02-12 NOTE — Patient Instructions (Signed)
Kegel Exercises  Kegel exercises can help strengthen your pelvic floor muscles. The pelvic floor is a group of muscles that support your rectum, small intestine, and bladder. In females, pelvic floor muscles also help support the womb (uterus). These muscles help you control the flow of urine and stool. Kegel exercises are painless and simple, and they do not require any equipment. Your provider may suggest Kegel exercises to: Improve bladder and bowel control. Improve sexual response. Improve weak pelvic floor muscles after surgery to remove the uterus (hysterectomy) or pregnancy (females). Improve weak pelvic floor muscles after prostate gland removal or surgery (males). Kegel exercises involve squeezing your pelvic floor muscles, which are the same muscles you squeeze when you try to stop the flow of urine or keep from passing gas. The exercises can be done while sitting, standing, or lying down, but itis best to vary your position. Exercises How to do Kegel exercises: Squeeze your pelvic floor muscles tight. You should feel a tight lift in your rectal area. If you are a female, you should also feel a tightness in your vaginal area. Keep your stomach, buttocks, and legs relaxed. Hold the muscles tight for up to 10 seconds. Breathe normally. Relax your muscles. Repeat as told by your health care provider. Repeat this exercise daily as told by your health care provider. Continue to do this exercise for at least 4-6 weeks, or for as long as told by your healthcare provider. You may be referred to a physical therapist who can help you learn more abouthow to do Kegel exercises. Depending on your condition, your health care provider may recommend: Varying how long you squeeze your muscles. Doing several sets of exercises every day. Doing exercises for several weeks. Making Kegel exercises a part of your regular exercise routine. This information is not intended to replace advice given to you by  your health care provider. Make sure you discuss any questions you have with your healthcare provider. Document Revised: 07/16/2020 Document Reviewed: 03/15/2018 Elsevier Patient Education  2022 Elsevier Inc.  

## 2021-02-12 NOTE — Progress Notes (Signed)
  OBSTETRICS POSTPARTUM CLINIC PROGRESS NOTE  Subjective:     Teresa [name] is a 20 y.o. G63P1001 female who presents for a postpartum visit. She is 6 weeks postpartum following a Term pregnancy or Uncomplicated pregnancy and delivery by  VAVD and 3rd degree lac repair .  I have fully reviewed the prenatal and intrapartum course. Anesthesia: epidural.  Postpartum course has been complicated by uncomplicated.  Baby is feeding by Breast.  Bleeding: patient has not  resumed menses.  Bowel function is normal. Bladder function is normal.  Patient is not sexually active. Contraception method desired is condoms.  Postpartum depression screening: negative. Edinburgh 7.  The following portions of the patient's history were reviewed and updated as appropriate: allergies, current medications, past family history, past medical history, past social history, past surgical history, and problem list.  Review of Systems Pertinent items are noted in HPI.  Objective:    BP 110/70   Ht 5\' 6"  (1.676 m)   Wt 161 lb (73 kg)   BMI 25.99 kg/m   General:  alert and no distress   Breasts:  inspection negative, no nipple discharge or bleeding, no masses or nodularity palpable  Lungs: clear to auscultation bilaterally  Heart:  regular rate and rhythm, S1, S2 normal, no murmur, click, rub or gallop  Abdomen: soft, non-tender; bowel sounds normal; no masses,  no organomegaly.     Vulva:  3rd degree lac repair area is 90% healed, some contact bleeding in area at introitus  Vagina: normal vagina, no discharge, exudate, lesion, or erythema  Cervix:  no cervical motion tenderness and no lesions  Corpus: normal size, contour, position, consistency, mobility, non-tender  Adnexa:  normal adnexa and no mass, fullness, tenderness  Rectal Exam: Not performed.         Assessment:  Post Partum Care visit 1. Postpartum care following vaginal delivery Cont healing from 3rd degree Recheck in amonth if she feels it has  not completely healed   Plan:  See orders and Patient Instructions Contraceptive counseling for condoms, all alternatves offfered and discussed Resume all normal activities Follow up in: 5 months for PAP visit; or as needed.   Barnett Applebaum, MD, [facility], [facility] 02/12/2021  2:57 PM

## 2021-03-03 ENCOUNTER — Telehealth: Payer: Self-pay

## 2021-03-03 NOTE — Telephone Encounter (Signed)
Please advise 

## 2021-03-03 NOTE — Telephone Encounter (Signed)
Copied from [place] [phone]. Topic: General - Other >> Mar 03, 2021  1:50 PM Lennox Solders wrote: Reason for CRM: Pt no longer want to see dr Ancil Boozer and would like to know if [facility]   will accept her as new pt

## 2021-03-11 ENCOUNTER — Ambulatory Visit (INDEPENDENT_AMBULATORY_CARE_PROVIDER_SITE_OTHER): Payer: Medicaid Other | Admitting: Family Medicine

## 2021-03-11 ENCOUNTER — Other Ambulatory Visit: Payer: Self-pay

## 2021-03-11 ENCOUNTER — Encounter: Payer: Self-pay | Admitting: Family Medicine

## 2021-03-11 VITALS — BP 120/70 | HR 96 | Temp 98.0°F | Resp 18 | Ht 66.0 in | Wt 175.2 lb

## 2021-03-11 DIAGNOSIS — D229 Melanocytic nevi, unspecified: Secondary | ICD-10-CM | POA: Diagnosis not present

## 2021-03-11 DIAGNOSIS — O99345 Other mental disorders complicating the puerperium: Secondary | ICD-10-CM

## 2021-03-11 DIAGNOSIS — F53 Postpartum depression: Secondary | ICD-10-CM | POA: Diagnosis not present

## 2021-03-11 NOTE — Assessment & Plan Note (Signed)
Positive Edinburgh today. Patient amenable to counseling, referral generated. Declines medication at this time. Safe at home and to self.

## 2021-03-11 NOTE — Assessment & Plan Note (Signed)
Benign appearance but irritative, referred to dermatology for removal.

## 2021-03-11 NOTE — Progress Notes (Signed)
    SUBJECTIVE:   CHIEF COMPLAINT / HPI:   MOLE CHECK - has had mole on back since birth. Since being pregnant has noticed mole getting bigger - no change in coloration or bleeding - does get snagged on clothes, itches. - wants derm referral for removal.   POSTPARTUM DEPRESSION - has been crying more recently - feeling safe at home and to self - good relationship with partner - is breastfeeding with good supply, baby gaining weight well - previously dxed with depression, not currently on medication or counseling.   Edinburgh Postnatal Depression Scale - 03/11/21 1437       Edinburgh Postnatal Depression Scale:  In the Past 7 Days   I have been able to laugh and see the funny side of things. 1    I have looked forward with enjoyment to things. 1    I have blamed myself unnecessarily when things went wrong. 1    I have been anxious or worried for no good reason. 3    I have felt scared or panicky for no good reason. 2    Things have been getting on top of me. 3    I have been so unhappy that I have had difficulty sleeping. 1    I have felt sad or miserable. 1    I have been so unhappy that I have been crying. 3    The thought of harming myself has occurred to me. 0    Edinburgh Postnatal Depression Scale Total 16             OBJECTIVE:   BP 120/70   Pulse 96   Temp 98 F (36.7 C) (Oral)   Resp 18   Ht '5\' 6"'$  (1.676 m)   Wt 175 lb 3.2 oz (79.5 kg)   SpO2 98%   Breastfeeding Yes   BMI 28.28 kg/m   Gen: well appearing, in NAD Skin: ~0.5cm raised, round nevus with well circumscribed borders and homogenous appearance. Psych: pleasant, appropriately dressed and well groomed. Nontangential thought process and nonpressured speech.   ASSESSMENT/PLAN:   Postpartum depression Positive Edinburgh today. Patient amenable to counseling, referral generated. Declines medication at this time. Safe at home and to self.   Enlarged skin mole Benign appearance but irritative,  referred to dermatology for removal.      Myles Gip, DO

## 2021-03-20 ENCOUNTER — Telehealth: Payer: Self-pay | Admitting: Licensed Clinical Social Worker

## 2021-03-20 ENCOUNTER — Ambulatory Visit: Payer: Self-pay

## 2021-03-20 NOTE — Patient Outreach (Signed)
  [facility]) Care Management  [facility] Social Work  03/20/2021  DONNARAE SATTERFIELD [birth year] [id number]  Encounter Medications:  Outpatient Encounter Medications as of 03/20/2021  Medication Sig   clotrimazole-betamethasone (LOTRISONE) cream Apply 1 application topically 2 (two) times daily. (Patient not taking: No sig reported)   No facility-administered encounter medications on file as of 03/20/2021.    Functional Status:  In your present state of health, do you have any difficulty performing the following activities: 03/11/2021 12/29/2020  Hearing? N N  Vision? N N  Difficulty concentrating or making decisions? N N  Walking or climbing stairs? N N  Dressing or bathing? N N  Doing errands, shopping? N N  Some recent data might be hidden    Fall/Depression Screening:  PHQ 2/9 Scores 02/12/2020 05/15/2019 11/28/2017 09/29/2016 09/23/2015 05/12/2015  PHQ - 2 Score 2 0 1 0 0 0  PHQ- 9 Score 11 0 4 0 - -    Assessment:  Care Plan There are no care plans that you recently modified to display for this patient.    Goals Addressed   None    LCSW completed [facility] outreach attempt today but was unable to reach patient successfully. A HIPPA compliant voice message was left encouraging patient to return call once available. LCSW will ask Scheduling Care Guide to reschedule [facility] SW appointment with patient as well.  Eula Fried, BSW, MSW, [employer] Managed Medicaid [email or web address] Phone: [phone]

## 2021-03-20 NOTE — Patient Instructions (Signed)
Irish Elders ,   [facility] Managed Care Team is available to provide assistance to you with your healthcare needs at no cost and as a benefit of your [facility] Health plan. I'm sorry I was unable to reach you today for our scheduled appointment. Our care guide will call you to reschedule our telephone appointment. Please call me at the number below. I am available to be of assistance to you regarding your healthcare needs. .   Thank you,   Eula Fried, BSW, MSW, LCSW Managed Medicaid [email or web address] [email or web address] Phone: [phone]

## 2021-04-02 ENCOUNTER — Other Ambulatory Visit: Payer: Self-pay

## 2021-04-02 NOTE — Patient Outreach (Addendum)
  [facility]) Care Management  [facility] Social Work  04/02/2021  Teresa [name] 2001-02-08 [id number]  Encounter Medications:  Outpatient Encounter Medications as of 04/02/2021  Medication Sig   clotrimazole-betamethasone (LOTRISONE) cream Apply 1 application topically 2 (two) times daily. (Patient not taking: No sig reported)   No facility-administered encounter medications on file as of 04/02/2021.    Functional Status:  In your present state of health, do you have any difficulty performing the following activities: 03/11/2021 12/29/2020  Hearing? N N  Vision? N N  Difficulty concentrating or making decisions? N N  Walking or climbing stairs? N N  Dressing or bathing? N N  Doing errands, shopping? N N  Some recent data might be hidden    Fall/Depression Screening:  PHQ 2/9 Scores 02/12/2020 05/15/2019 11/28/2017 09/29/2016 09/23/2015 05/12/2015  PHQ - 2 Score 2 0 1 0 0 0  PHQ- 9 Score 11 0 4 0 - -    Assessment:  Care Plan There are no care plans that you recently modified to display for this patient.    Goals Addressed   None    LCSW completed [facility] outreach attempt today but was unable to reach patient successfully. A HIPPA compliant voice message was left encouraging patient to return call once available. LCSW will reschedule patient's [facility] Social Work appointment to 04/08/21 if no return call has been made.  Two HIPPA compliant phone messages were left for the patient providing contact information and requesting a return call. [facility] LCSW also provided her with her new rescheduled appointment date and time information. [facility] LCSW received a secure text message from patient later in the day stating that she had her phone on do no disturb and is agreeable to her new scheduled SW appointment next week on 04/08/21 at 1:45 pm (next Wednesday)   Eula Fried, BSW, MSW, [employer] Managed Medicaid LCSW [email or web address] Phone:  [phone].

## 2021-04-02 NOTE — Patient Instructions (Addendum)
Irish Elders ,   [facility] Managed Care Team is available to provide assistance to you with your healthcare needs at no cost and as a benefit of your [facility] Health plan. I'm sorry I was unable to reach you today for our scheduled appointment. Your new appointment is 04/08/21 at 1:45 pm. Please call me at the number below if needed. I am available to be of assistance to you regarding your healthcare needs. .   Thank you,   Eula Fried, BSW, MSW, LCSW Managed Medicaid LCSW [email or web address] Phone: [phone]

## 2021-04-08 ENCOUNTER — Other Ambulatory Visit: Payer: Self-pay | Admitting: Licensed Clinical Social Worker

## 2021-04-08 DIAGNOSIS — F331 Major depressive disorder, recurrent, moderate: Secondary | ICD-10-CM

## 2021-04-08 NOTE — Patient Outreach (Signed)
Medicaid Managed Care Social Work Note  04/08/2021 Name:  Teresa [name] MRN:  [id number] DOB:  08/14/2000  Teresa [name] is an 20 y.o. year old female who is a primary patient of Steele Sizer, MD.  The Medicaid Managed Care Coordination team was consulted for assistance with:  [place] and Resources  Ms. Swem was given information about Medicaid Managed Care Coordination team services today. [name] agreed to services and verbal consent obtained.  Engaged with patient  for by telephone forinitial visit in response to referral for case management and/or care coordination services.   Assessments/Interventions:  Review of past medical history, allergies, medications, health status, including review of consultants reports, laboratory and other test data, was performed as part of comprehensive evaluation and provision of chronic care management services.   Goals Addressed             This Visit's Progress    Begin and Stick with Counseling-Depression       Timeframe:  Long-Range Goal Priority:  High Start Date:    04/08/21                      Expected End Date:  ongoing                     Follow Up Date- 04/23/21   - check out counseling - keep 90 percent of counseling appointments - schedule counseling appointment    Why is this important?   Beating depression may take some time.  If you don't feel better right away, don't give up on your treatment plan.    Notes:         SDOH: (Social Determinant of Health) assessments and interventions performed: SDOH Interventions    Flowsheet Row Most Recent Value  SDOH Interventions   SDOH Interventions for the Following Domains Depression, Stress  Stress Interventions Provide Counseling  Depression Interventions/Treatment  Referral to Psychiatry       Advanced Directives Status:  See Care Plan for related entries.  Care Plan                 No Known Allergies  Medications Reviewed Today      Reviewed by Hollie Salk, RMA (Registered Medical Assistant) on 03/11/21 at 46  Med List Status: <None>   Medication Order Taking? Sig Documenting Provider Last Dose Status Informant  clotrimazole-betamethasone (LOTRISONE) cream 99991111 No Apply 1 application topically 2 (two) times daily.  Patient not taking: No sig reported   Hollice Espy, MD Not Taking Active             Patient Active Problem List   Diagnosis Date Noted   Enlarged skin mole 03/11/2021   Encounter for care or examination of lactating mother 12/31/2020   Postpartum care following vaginal delivery 12/31/2020   Encounter for elective induction of labor 12/29/2020   [redacted] weeks gestation of pregnancy 12/29/2020   Third degree perineal laceration during delivery with less than 50% tear of external anal sphincter 12/29/2020   Supervision of low-risk first pregnancy 05/14/2020   Generalized anxiety disorder 02/12/2020   Postpartum depression 02/12/2020   History of suicide attempt 06/01/2016   MDD (major depressive disorder), recurrent episode, moderate ([place]) 05/31/2016   History of ankle sprain 09/23/2015   AD (atopic dermatitis) 05/12/2015   Elevated TSH 05/12/2015   History of chlamydia infection 05/12/2015    Conditions to be addressed/monitored per PCP order:  Anxiety and  Depression  Patient Care Plan: General Social Work (Adult)     Problem Identified: Depression Identification (Depression)      Long-Range Goal: Depressive Symptoms Identified   Start Date: 04/08/2021  Priority: High  Note:   Timeframe:  Long-Range Goal Priority:  High Start Date:    04/08/21                      Expected End Date:  ongoing                     Follow Up Date- 04/23/21   - check out counseling - keep 90 percent of counseling appointments - schedule counseling appointment    Why is this important?   Beating depression may take some time.  If you don't feel better right away, don't give up on your  treatment plan.     Current barriers:   Chronic Mental Health needs related to adjusting to motherhood, postpartum depression, anxiety Limited social support and Mental Health Concerns  Needs Support, Education, and Care Coordination in order to meet unmet mental health needs. Clinical Goal(s): demonstrate a reduction in symptoms related to :ADHD, Depression, connect with provider for ongoing mental health treatment.  , and increase coping skills, healthy habits, self-management skills, and stress reduction     Clinical Interventions:  Patient has ADHD, postpartum depression and limited support. Patient prefers in person treatment but can do virtual which would help out with childcare cost. Referral made to [facility] on 04/08/21. Inter-disciplinary care team collaboration (see longitudinal plan of care) Assessed patient's previous and current treatment, coping skills, support system and barriers to care  Review various resources, discussed options and provided patient information about Options for mental health treatment based on need and insurance Solution-Focused Strategies, Mindfulness or Relaxation Training, Active listening / Reflection utilized , Emotional Supportive Provided, Behavioral Activation, [place] , Provided psychoeducation for mental health needs , Motivational Interviewing, Provided brief CBT , Participation in counseling encouraged , Verbalization of feelings encouraged , Suicidal Ideation/Homicidal Ideation assessed:, [place] , and Made referral to [facility] for counseling and medication management on 04/08/21  ; Patient Goals/Self-Care Activities: Over the next 120 days - participation in mental health treatment encouraged - self-awareness of emotional triggers encouraged - strategies to manage emotional triggers promoted - suicide risk screen reviewed - avoid negative self-talk - develop a personal safety plan - develop a plan to  deal with triggers like holidays, anniversaries - exercise at least 2 to 3 times per week - have a plan for how to handle bad days - journal feelings and what helps to feel better or worse - spend time or talk with others at least 2 to 3 times per week - spend time or talk with others every day - watch for early signs of feeling worse - write in journal every day - begin personal counseling - call and visit an old friend - check out volunteer opportunities - join a support group - laugh; watch a funny movie or comedian - learn and use visualization or guided imagery - perform a random act of kindness - practice relaxation or meditation daily - start or continue a personal journal - talk about feelings with a friend, family or spiritual advisor - practice positive thinking and self-talk I have placed a referral to [facility]       Follow up:  Patient agrees to Care Plan and Follow-up.  Plan: The  Managed Medicaid care management team will reach out to the patient again over the next 30 days.  Date/time of next scheduled Social Work care management/care coordination outreach:  04/23/21 at 3 pm.  Eula Fried, [place], MSW, [place] Medicaid [email or web address] Phone: [phone]

## 2021-04-09 NOTE — Patient Instructions (Signed)
Visit Information  Teresa [name] was given information about Medicaid Managed Care team care coordination services as a part of their Healthy Blue Medicaid benefit. Teresa [name] verbally consented to engagement with the [facility] Managed Care team.   If you are experiencing a medical emergency, please call 911 or report to your local emergency department or urgent care.   If you have a non-emergency medical problem during routine business hours, please contact your provider's office and ask to speak with a nurse.   For questions related to your Healthy [facility] health plan, please call: [phone] or visit the homepage here: [email or web address]  If you would like to schedule transportation through your Healthy [facility] plan, please call the following number at least 2 days in advance of your appointment: [phone]  Call the [place] at [phone], at any time, 24 hours a day, 7 days a week. If you are in danger or need immediate medical attention call 911.  If you would like help to quit smoking, call 1-800-QUIT-NOW 8633635069) OR Espaol: 1-855-Djelo-Ya HD:1601594) o para ms informacin haga clic aqu or Text READY to 200-400 to register via text  Teresa [name] - following are the goals we discussed in your visit today:   Goals Addressed             This Visit's Progress    Begin and Stick with Counseling-Depression       Timeframe:  Long-Range Goal Priority:  High Start Date:    04/08/21                      Expected End Date:  ongoing                     Follow Up Date- 04/23/21   - check out counseling - keep 90 percent of counseling appointments - schedule counseling appointment    Why is this important?   Beating depression may take some time.  If you don't feel better right away, don't give up on your treatment plan.    Notes:         Eula Fried, BSW, MSW, [employer] Managed Medicaid  [email or web address] Phone: [phone]

## 2021-04-15 ENCOUNTER — Other Ambulatory Visit: Payer: Self-pay

## 2021-04-15 NOTE — Patient Instructions (Signed)
Visit Information  Ms. Ferone was given information about Medicaid Managed Care team care coordination services as a part of their Healthy [employer] Medicaid benefit. NIKIE QADRI verbally consented to engagement with the [facility] Managed Care team.   If you are experiencing a medical emergency, please call 911 or report to your local emergency department or urgent care.   If you have a non-emergency medical problem during routine business hours, please contact your provider's office and ask to speak with a nurse.   For questions related to your Healthy [facility] health plan, please call: [phone] or visit the homepage here: [email or web address]  If you would like to schedule transportation through your Healthy [facility] plan, please call the following number at least 2 days in advance of your appointment: [phone]  Call the [place] at [phone], at any time, 24 hours a day, 7 days a week. If you are in danger or need immediate medical attention call 911.  If you would like help to quit smoking, call 1-800-QUIT-NOW 647-269-9090) OR Espaol: 1-855-Djelo-Ya QO:409462) o para ms informacin haga clic aqu or Text READY to 200-400 to register via text  Ms. Royce Macadamia - following are the goals we discussed in your visit today:   Goals Addressed   None     Social Worker will follow up in 7 days.   [name], BSW, [place]  High Risk Managed Medicaid Team  [phone]   Following is a copy of your plan of care:  Patient Care Plan: General Social Work (Adult)     Problem Identified: Depression Identification (Depression)      Long-Range Goal: Depressive Symptoms Identified   Start Date: 04/08/2021  Priority: High  Note:   Timeframe:  Long-Range Goal Priority:  High Start Date:    04/08/21                      Expected End Date:  ongoing                      Follow Up Date- 04/23/21   - check out counseling - keep 90 percent of counseling appointments - schedule counseling appointment    Why is this important?   Beating depression may take some time.  If you don't feel better right away, don't give up on your treatment plan.     Current barriers:   Chronic Mental Health needs related to adjusting to motherhood, postpartum depression, anxiety Limited social support and Mental Health Concerns  Needs Support, Education, and Care Coordination in order to meet unmet mental health needs. Clinical Goal(s): demonstrate a reduction in symptoms related to :ADHD, Depression, connect with provider for ongoing mental health treatment.  , and increase coping skills, healthy habits, self-management skills, and stress reduction     Clinical Interventions:  Patient has ADHD, postpartum depression and limited support. Patient prefers in person treatment but can do virtual which would help out with childcare cost. Referral made to [facility] on 04/08/21. Inter-disciplinary care team collaboration (see longitudinal plan of care) Assessed patient's previous and current treatment, coping skills, support system and barriers to care  Review various resources, discussed options and provided patient information about Options for mental health treatment based on need and insurance Solution-Focused Strategies, Mindfulness or Relaxation Training, Active listening / Reflection utilized , Emotional Supportive Provided, Behavioral Activation, [place] , Provided psychoeducation for mental health needs , Motivational Interviewing,  Provided brief CBT , Participation in counseling encouraged , Verbalization of feelings encouraged , Suicidal Ideation/Homicidal Ideation assessed:, Discussed [place] , and Made referral to [facility] for counseling and medication management on 04/08/21  ; BSW completed appointment with patient for food resources. 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 Patient states she is not receiving any food assistance but has food. BSW provided patient with the phone number [phone] to [place] to complete an application for foodstamps. Patient states she has not heard anything back from [facility] about an appointment.   Patient Goals/Self-Care Activities: Over the next 120 days - participation in mental health treatment encouraged - self-awareness of emotional triggers encouraged - strategies to manage emotional triggers promoted - suicide risk screen reviewed - avoid negative self-talk - develop a personal safety plan - develop a plan to deal with triggers like holidays, anniversaries - exercise at least 2 to 3 times per week - have a plan for how to handle bad days - journal feelings and what helps to feel better or worse - spend time or talk with others at least 2 to 3 times per week - spend time or talk with others every day - watch for early signs of feeling worse - write in journal every day - begin personal counseling - call and visit an old friend - check out volunteer opportunities - join a support group - laugh; watch a funny movie or comedian - learn and use visualization or guided imagery - perform a random act of kindness - practice relaxation or meditation daily - start or continue a personal journal - talk about feelings with a friend, family or spiritual advisor - practice positive thinking and self-talk I have placed a referral to [employer]

## 2021-04-15 NOTE — Patient Outreach (Signed)
Medicaid Managed Care Social Work Note  04/15/2021 Name:  Teresa [name] MRN:  [id number] DOB:  2001-06-25  Teresa [name] is an 20 y.o. year old female who is a primary patient of Steele Sizer, MD.  The [facility] Managed Care Coordination team was consulted for assistance with:  Food Insecurity  [name] was given information about Medicaid Managed Care Coordination team services today. [name] agreed to services and verbal consent obtained.  Engaged with patient  for by telephone forinitial visit in response to referral for case management and/or care coordination services.   Assessments/Interventions:  Review of past medical history, allergies, medications, health status, including review of consultants reports, laboratory and other test data, was performed as part of comprehensive evaluation and provision of chronic care management services.  SDOH: (Social Determinant of Health) assessments and interventions performed:  BSW completed appointment with patient for food resources. 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 Patient states she is not receiving any food assistance but has food. BSW provided patient with the phone number [phone] to [place] to complete an application for foodstamps. Patient states she has not heard anything back from [facility] about an appointment  Advanced Directives Status:  Not addressed in this encounter.  Care Plan                 No Known Allergies  Medications Reviewed Today     Reviewed by Hollie Salk, RMA (Registered Medical Assistant) on 03/11/21 at 2  Med List Status: <None>   Medication Order Taking? Sig Documenting Provider Last Dose Status Informant  clotrimazole-betamethasone (LOTRISONE) cream 99991111 No Apply 1 application topically 2 (two) times daily.  Patient not taking: No sig reported   Hollice Espy, MD Not Taking Active              Patient Active Problem List   Diagnosis Date Noted   Enlarged skin mole 03/11/2021   Encounter for care or examination of lactating mother 12/31/2020   Postpartum care following vaginal delivery 12/31/2020   Encounter for elective induction of labor 12/29/2020   [redacted] weeks gestation of pregnancy 12/29/2020   Third degree perineal laceration during delivery with less than 50% tear of external anal sphincter 12/29/2020   Supervision of low-risk first pregnancy 05/14/2020   Generalized anxiety disorder 02/12/2020   Postpartum depression 02/12/2020   History of suicide attempt 06/01/2016   MDD (major depressive disorder), recurrent episode, moderate ([place]) 05/31/2016   History of ankle sprain 09/23/2015   AD (atopic dermatitis) 05/12/2015   Elevated TSH 05/12/2015   History of chlamydia infection 05/12/2015    Conditions to be addressed/monitored per PCP order:  Depression  Care Plan : General Social Work (Adult)  Updates made by Ethelda Chick since 04/15/2021 12:00 AM     Problem: Depression Identification (Depression)      Long-Range Goal: Depressive Symptoms Identified   Start Date: 04/08/2021  Priority: High  Note:   Timeframe:  Long-Range Goal Priority:  High Start Date:    04/08/21                      Expected End Date:  ongoing                     Follow Up Date- 04/23/21   - check out counseling - keep 90 percent of counseling appointments -  schedule counseling appointment    Why is this important?   Beating depression may take some time.  If you don't feel better right away, don't give up on your treatment plan.     Current barriers:   Chronic Mental Health needs related to adjusting to motherhood, postpartum depression, anxiety Limited social support and Mental Health Concerns  Needs Support, Education, and Care Coordination in order to meet unmet mental health needs. Clinical Goal(s): demonstrate a reduction in symptoms related to :ADHD, Depression,  connect with provider for ongoing mental health treatment.  , and increase coping skills, healthy habits, self-management skills, and stress reduction     Clinical Interventions:  Patient has ADHD, postpartum depression and limited support. Patient prefers in person treatment but can do virtual which would help out with childcare cost. Referral made to [facility] on 04/08/21. Inter-disciplinary care team collaboration (see longitudinal plan of care) Assessed patient's previous and current treatment, coping skills, support system and barriers to care  Review various resources, discussed options and provided patient information about Options for mental health treatment based on need and insurance Solution-Focused Strategies, Mindfulness or Relaxation Training, Active listening / Reflection utilized , Emotional Supportive Provided, Behavioral Activation, [place] , Provided psychoeducation for mental health needs , Motivational Interviewing, Provided brief CBT , Participation in counseling encouraged , Verbalization of feelings encouraged , Suicidal Ideation/Homicidal Ideation assessed:, [place] , and Made referral to [facility] for counseling and medication management on 04/08/21  ; BSW completed appointment with patient for food resources. 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 Patient states she is not receiving any food assistance but has food. BSW provided patient with the phone number [phone] to [place] to complete an application for foodstamps. Patient states she has not heard anything back from [facility] about an appointment.   Patient Goals/Self-Care Activities: Over the next 120 days - participation in mental health treatment encouraged - self-awareness of emotional triggers encouraged - strategies to manage emotional triggers promoted - suicide risk screen reviewed - avoid negative  self-talk - develop a personal safety plan - develop a plan to deal with triggers like holidays, anniversaries - exercise at least 2 to 3 times per week - have a plan for how to handle bad days - journal feelings and what helps to feel better or worse - spend time or talk with others at least 2 to 3 times per week - spend time or talk with others every day - watch for early signs of feeling worse - write in journal every day - begin personal counseling - call and visit an old friend - check out volunteer opportunities - join a support group - laugh; watch a funny movie or comedian - learn and use visualization or guided imagery - perform a random act of kindness - practice relaxation or meditation daily - start or continue a personal journal - talk about feelings with a friend, family or spiritual advisor - practice positive thinking and self-talk I have placed a referral to [facility]       Follow up:  Patient agrees to Care Plan and Follow-up.  Plan: The Managed Medicaid care management team will reach out to the patient again over the next 7 days.  Date/time of next scheduled Social Work care management/care coordination outreach: 04/24/21  [name], BSW, [place]  High Risk  Managed Medicaid Team  [phone]

## 2021-04-20 ENCOUNTER — Ambulatory Visit: Payer: Self-pay

## 2021-04-23 ENCOUNTER — Other Ambulatory Visit: Payer: Self-pay

## 2021-04-23 NOTE — Patient Outreach (Signed)
Medicaid Managed Care Social Work Note  04/23/2021 Name:  Teresa [name] MRN:  [id number] DOB:  January 28, 2001  Teresa [name] is an 20 y.o. year old female who is a primary patient of Steele Sizer, MD.  The Medicaid Managed Care Coordination team was consulted for assistance with:  [place] and Resources  Teresa [name] was given information about Medicaid Managed Care Coordination team services today. Teresa [name] Patient agreed to services and verbal consent obtained.  Engaged with patient  for by telephone forfollow up visit in response to referral for case management and/or care coordination services.   Assessments/Interventions:  Review of past medical history, allergies, medications, health status, including review of consultants reports, laboratory and other test data, was performed as part of comprehensive evaluation and provision of chronic care management services.  SDOH: (Social Determinant of Health) assessments and interventions performed:   Advanced Directives Status:  See Care Plan for related entries.  Care Plan                 No Known Allergies  Medications Reviewed Today     Reviewed by Teresa [name], RMA (Registered Medical Assistant) on 03/11/21 at 38  Med List Status: <None>   Medication Order Taking? Sig Documenting Provider Last Dose Status Informant  clotrimazole-betamethasone (LOTRISONE) cream 99991111 No Apply 1 application topically 2 (two) times daily.  Patient not taking: No sig reported   Hollice Espy, MD Not Taking Active             Patient Active Problem List   Diagnosis Date Noted   Enlarged skin mole 03/11/2021   Encounter for care or examination of lactating mother 12/31/2020   Postpartum care following vaginal delivery 12/31/2020   Encounter for elective induction of labor 12/29/2020   [redacted] weeks gestation of pregnancy 12/29/2020   Third degree perineal laceration during delivery with less than 50% tear of external  anal sphincter 12/29/2020   Supervision of low-risk first pregnancy 05/14/2020   Generalized anxiety disorder 02/12/2020   Postpartum depression 02/12/2020   History of suicide attempt 06/01/2016   MDD (major depressive disorder), recurrent episode, moderate ([place]) 05/31/2016   History of ankle sprain 09/23/2015   AD (atopic dermatitis) 05/12/2015   Elevated TSH 05/12/2015   History of chlamydia infection 05/12/2015    Conditions to be addressed/monitored per PCP order:  Anxiety and Depression  Care Plan : General Social Work (Adult)  Updates made by Greg Cutter, LCSW since 04/23/2021 12:00 AM     Problem: Depression Identification (Depression)      Long-Range Goal: Depressive Symptoms Identified   Start Date: 04/08/2021  Priority: High  Note:   Timeframe:  Long-Range Goal Priority:  High Start Date:    04/08/21                      Expected End Date:  ongoing                     Follow Up Date- 05/17/21   - check out counseling - keep 90 percent of counseling appointments - schedule counseling appointment    Why is this important?   Beating depression may take some time.  If you don't feel better right away, don't give up on your treatment plan.     Current barriers:   Chronic Mental Health needs related to adjusting to motherhood, postpartum depression, anxiety Limited social support and Mental Health Concerns  Needs Support, Education,  and Care Coordination in order to meet unmet mental health needs. Clinical Goal(s): demonstrate a reduction in symptoms related to :ADHD, Depression, connect with provider for ongoing mental health treatment.  , and increase coping skills, healthy habits, self-management skills, and stress reduction     Clinical Interventions:  Patient has ADHD, postpartum depression and limited support. Patient prefers in person treatment but can do virtual which would help out with childcare cost. Referral made to [facility] on 04/08/21. Update- Patient has  not heard from [place] yet. [facility] LCSW completed three way call to [facility] and left message with the referral coordinator. Patient will contact [facility] herself tomorrow if she has not received a return call by 4 pm on 04/24/21. Inter-disciplinary care team collaboration (see longitudinal plan of care) Assessed patient's previous and current treatment, coping skills, support system and barriers to care  Review various resources, discussed options and provided patient information about Options for mental health treatment based on need and insurance Solution-Focused Strategies, Mindfulness or Relaxation Training, Active listening / Reflection utilized , Emotional Supportive Provided, Behavioral Activation, [place] , Provided psychoeducation for mental health needs , Motivational Interviewing, Provided brief CBT , Participation in counseling encouraged , Verbalization of feelings encouraged , Suicidal Ideation/Homicidal Ideation assessed:, [place] , and Made referral to [facility] for counseling and medication management on 04/08/21  ; BSW completed appointment with patient for food resources. 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 Patient states she is not receiving any food assistance but has food. BSW provided patient with the phone number [phone] to [place] to complete an application for foodstamps. Patient states she has not heard anything back from [facility] about an appointment.   Patient Goals/Self-Care Activities: Over the next 120 days - participation in mental health treatment encouraged - self-awareness of emotional triggers encouraged - strategies to manage emotional triggers promoted - suicide risk screen reviewed - avoid negative self-talk - develop a personal safety plan - develop a plan to deal with triggers like holidays, anniversaries - exercise at least 2 to 3 times per week - have a  plan for how to handle bad days - journal feelings and what helps to feel better or worse - spend time or talk with others at least 2 to 3 times per week - spend time or talk with others every day - watch for early signs of feeling worse - write in journal every day - begin personal counseling - call and visit an old friend - check out volunteer opportunities - join a support group - laugh; watch a funny movie or comedian - learn and use visualization or guided imagery - perform a random act of kindness - practice relaxation or meditation daily - start or continue a personal journal - talk about feelings with a friend, family or spiritual advisor - practice positive thinking and self-talk I have placed a referral to [facility]       Follow up:  Patient agrees to Care Plan and Follow-up.  Plan: The Managed Medicaid care management team will reach out to the patient again over the next 45 days.  Date of next scheduled Social Work care management/care coordination outreach:  05/17/21  Teresa [name], BSW, MSW, LCSW Managed Medicaid LCSW [email or web address] Phone: [phone]

## 2021-04-23 NOTE — Patient Instructions (Signed)
Visit Information  Teresa [name] was given information about Medicaid Managed Care team care coordination services as a part of their Healthy Blue Medicaid benefit. Teresa [name] verbally consented to engagement with the [facility] Managed Care team.   If you are experiencing a medical emergency, please call 911 or report to your local emergency department or urgent care.   If you have a non-emergency medical problem during routine business hours, please contact your provider's office and ask to speak with a nurse.   For questions related to your Healthy [facility] health plan, please call: [phone] or visit the homepage here: [email or web address]  If you would like to schedule transportation through your Healthy [facility] plan, please call the following number at least 2 days in advance of your appointment: [phone]  Call the [place] at [phone], at any time, 24 hours a day, 7 days a week. If you are in danger or need immediate medical attention call 911.  If you would like help to quit smoking, call 1-800-QUIT-NOW 220 516 3273) OR Espaol: 1-855-Djelo-Ya HD:1601594) o para ms informacin haga clic aqu or Text READY to 200-400 to register via text  Teresa [name] - following are the goals we discussed in your visit today:   Goals Addressed             This Visit's Progress    Begin and Stick with Counseling-Depression       Timeframe:  Long-Range Goal Priority:  High Start Date:    04/08/21                      Expected End Date:  ongoing                     Follow Up Date- 05/17/21   - check out counseling - keep 90 percent of counseling appointments - schedule counseling appointment    Why is this important?   Beating depression may take some time.  If you don't feel better right away, don't give up on your treatment plan.    Notes:        Eula Fried, BSW, MSW, [facility] Managed Medicaid  [email or web address] Phone: [phone]

## 2021-04-24 ENCOUNTER — Other Ambulatory Visit: Payer: Self-pay

## 2021-04-24 NOTE — Patient Instructions (Signed)
Visit Information  Teresa [name] was given information about Medicaid Managed Care team care coordination services as a part of their Healthy Blue Medicaid benefit. Teresa [name] verbally consented to engagement with the [facility] Managed Care team.   If you are experiencing a medical emergency, please call 911 or report to your local emergency department or urgent care.   If you have a non-emergency medical problem during routine business hours, please contact your provider's office and ask to speak with a nurse.   For questions related to your Healthy [facility] health plan, please call: [phone] or visit the homepage here: [email or web address]  If you would like to schedule transportation through your Healthy [facility] plan, please call the following number at least 2 days in advance of your appointment: [phone]  Call the [place] at [phone], at any time, 24 hours a day, 7 days a week. If you are in danger or need immediate medical attention call 911.  If you would like help to quit smoking, call 1-800-QUIT-NOW 918-679-0645) OR Espaol: 1-855-Djelo-Ya QO:409462) o para ms informacin haga clic aqu or Text READY to 200-400 to register via text  Teresa [name] - following are the goals we discussed in your visit today:   Goals Addressed   None       Social Worker will follow up in 30 days.   [name], BSW, [place]  High Risk Managed Medicaid Team  [phone]   Following is a copy of your plan of care:  Care Plan : General Social Work (Adult)  Updates made by Ethelda Chick since 04/24/2021 12:00 AM     Problem: Depression Identification (Depression)      Long-Range Goal: Depressive Symptoms Identified   Start Date: 04/08/2021  Priority: High  Note:   Timeframe:  Long-Range Goal Priority:  High Start Date:    04/08/21                       Expected End Date:  ongoing                     Follow Up Date- 05/17/21   - check out counseling - keep 90 percent of counseling appointments - schedule counseling appointment    Why is this important?   Beating depression may take some time.  If you don't feel better right away, don't give up on your treatment plan.     Current barriers:   Chronic Mental Health needs related to adjusting to motherhood, postpartum depression, anxiety Limited social support and Mental Health Concerns  Needs Support, Education, and Care Coordination in order to meet unmet mental health needs. Clinical Goal(s): demonstrate a reduction in symptoms related to :ADHD, Depression, connect with provider for ongoing mental health treatment.  , and increase coping skills, healthy habits, self-management skills, and stress reduction     Clinical Interventions:  Patient has ADHD, postpartum depression and limited support. Patient prefers in person treatment but can do virtual which would help out with childcare cost. Referral made to [facility] on 04/08/21. Update- Patient has not heard from [place] yet. [facility] LCSW completed three way call to [facility] and left message with the referral coordinator. Patient will contact [facility] herself tomorrow if she has not received a return call by 4 pm on 04/24/21. Inter-disciplinary care team collaboration (see longitudinal plan of care) Assessed patient's previous and current treatment, coping skills, support system and barriers  to care  Review various resources, discussed options and provided patient information about Options for mental health treatment based on need and insurance Solution-Focused Strategies, Mindfulness or Relaxation Training, Active listening / Reflection utilized , Emotional Supportive Provided, Behavioral Activation, [place] , Provided psychoeducation for mental health needs , Motivational Interviewing, Provided brief CBT , Participation in counseling  encouraged , Verbalization of feelings encouraged , Suicidal Ideation/Homicidal Ideation assessed:, [place] , and Made referral to [facility] for counseling and medication management on 04/08/21  ; BSW completed appointment with patient for food resources. 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 Patient states she is not receiving any food assistance but has food. BSW provided patient with the phone number [phone] to [place] to complete an application for foodstamps. Patient states she has not heard anything back from [facility] about an appointment.   04/24/21: BSW contacted patient to follow up. She stated she has not applied for Foodstamps yet because she is trying to get all of daughters financial information together. Patient stated she did receive a voicemail from [facility] this morning, and once she puts her baby down for a nap she will give them a call. No other resources needed at this time. Patient Goals/Self-Care Activities: Over the next 120 days - participation in mental health treatment encouraged - self-awareness of emotional triggers encouraged - strategies to manage emotional triggers promoted - suicide risk screen reviewed - avoid negative self-talk - develop a personal safety plan - develop a plan to deal with triggers like holidays, anniversaries - exercise at least 2 to 3 times per week - have a plan for how to handle bad days - journal feelings and what helps to feel better or worse - spend time or talk with others at least 2 to 3 times per week - spend time or talk with others every day - watch for early signs of feeling worse - write in journal every day - begin personal counseling - call and visit an old friend - check out volunteer opportunities - join a support group - laugh; watch a funny movie or comedian - learn and use visualization or guided imagery - perform a random act of  kindness - practice relaxation or meditation daily - start or continue a personal journal - talk about feelings with a friend, family or spiritual advisor - practice positive thinking and self-talk I have placed a referral to [employer]

## 2021-04-24 NOTE — Patient Outreach (Signed)
Medicaid Managed Care Social Work Note  04/24/2021 Name:  Teresa [name] MRN:  [id number] DOB:  12-02-2000  Teresa [name] is an 20 y.o. year old female who is a primary patient of Steele Sizer, MD.  The Medicaid Managed Care Coordination team was consulted for assistance with:  Community Resources   [name] was given information about Medicaid Managed Care Coordination team services today. [name] agreed to services and verbal consent obtained.  Engaged with patient  for by telephone forfollow up visit in response to referral for case management and/or care coordination services.   Assessments/Interventions:  Review of past medical history, allergies, medications, health status, including review of consultants reports, laboratory and other test data, was performed as part of comprehensive evaluation and provision of chronic care management services.  SDOH: (Social Determinant of Health) assessments and interventions performed:  04/24/21: BSW contacted patient to follow up. She stated she has not applied for Foodstamps yet because she is trying to get all of daughters financial information together. Patient stated she did receive a voicemail from [facility] this morning, and once she puts her baby down for a nap she will give them a call. No other resources needed at this time.   Advanced Directives Status:  Not addressed in this encounter.  Care Plan                 No Known Allergies  Medications Reviewed Today     Reviewed by Hollie Salk, RMA (Registered Medical Assistant) on 03/11/21 at 82  Med List Status: <None>   Medication Order Taking? Sig Documenting Provider Last Dose Status Informant  clotrimazole-betamethasone (LOTRISONE) cream 99991111 No Apply 1 application topically 2 (two) times daily.  Patient not taking: No sig reported   Hollice Espy, MD Not Taking Active             Patient Active Problem List   Diagnosis Date Noted   Enlarged skin  mole 03/11/2021   Encounter for care or examination of lactating mother 12/31/2020   Postpartum care following vaginal delivery 12/31/2020   Encounter for elective induction of labor 12/29/2020   [redacted] weeks gestation of pregnancy 12/29/2020   Third degree perineal laceration during delivery with less than 50% tear of external anal sphincter 12/29/2020   Supervision of low-risk first pregnancy 05/14/2020   Generalized anxiety disorder 02/12/2020   Postpartum depression 02/12/2020   History of suicide attempt 06/01/2016   MDD (major depressive disorder), recurrent episode, moderate ([place]) 05/31/2016   History of ankle sprain 09/23/2015   AD (atopic dermatitis) 05/12/2015   Elevated TSH 05/12/2015   History of chlamydia infection 05/12/2015    Conditions to be addressed/monitored per PCP order:   community resources  Care Plan : General Social Work (Adult)  Updates made by Ethelda Chick since 04/24/2021 12:00 AM     Problem: Depression Identification (Depression)      Long-Range Goal: Depressive Symptoms Identified   Start Date: 04/08/2021  Priority: High  Note:   Timeframe:  Long-Range Goal Priority:  High Start Date:    04/08/21                      Expected End Date:  ongoing                     Follow Up Date- 05/17/21   - check out counseling - keep 90 percent of counseling appointments - schedule counseling appointment  Why is this important?   Beating depression may take some time.  If you don't feel better right away, don't give up on your treatment plan.     Current barriers:   Chronic Mental Health needs related to adjusting to motherhood, postpartum depression, anxiety Limited social support and Mental Health Concerns  Needs Support, Education, and Care Coordination in order to meet unmet mental health needs. Clinical Goal(s): demonstrate a reduction in symptoms related to :ADHD, Depression, connect with provider for ongoing mental health treatment.  , and  increase coping skills, healthy habits, self-management skills, and stress reduction     Clinical Interventions:  Patient has ADHD, postpartum depression and limited support. Patient prefers in person treatment but can do virtual which would help out with childcare cost. Referral made to [facility] on 04/08/21. Update- Patient has not heard from [place] yet. [facility] LCSW completed three way call to [facility] and left message with the referral coordinator. Patient will contact [facility] herself tomorrow if she has not received a return call by 4 pm on 04/24/21. Inter-disciplinary care team collaboration (see longitudinal plan of care) Assessed patient's previous and current treatment, coping skills, support system and barriers to care  Review various resources, discussed options and provided patient information about Options for mental health treatment based on need and insurance Solution-Focused Strategies, Mindfulness or Relaxation Training, Active listening / Reflection utilized , Emotional Supportive Provided, Behavioral Activation, [place] , Provided psychoeducation for mental health needs , Motivational Interviewing, Provided brief CBT , Participation in counseling encouraged , Verbalization of feelings encouraged , Suicidal Ideation/Homicidal Ideation assessed:, [place] , and Made referral to [facility] for counseling and medication management on 04/08/21  ; BSW completed appointment with patient for food resources. 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 Patient states she is not receiving any food assistance but has food. BSW provided patient with the phone number [phone] to [place] to complete an application for foodstamps. Patient states she has not heard anything back from [facility] about an appointment.   04/24/21: BSW contacted patient to follow up. She stated she has not applied for Foodstamps yet because  she is trying to get all of daughters financial information together. Patient stated she did receive a voicemail from [facility] this morning, and once she puts her baby down for a nap she will give them a call. No other resources needed at this time. Patient Goals/Self-Care Activities: Over the next 120 days - participation in mental health treatment encouraged - self-awareness of emotional triggers encouraged - strategies to manage emotional triggers promoted - suicide risk screen reviewed - avoid negative self-talk - develop a personal safety plan - develop a plan to deal with triggers like holidays, anniversaries - exercise at least 2 to 3 times per week - have a plan for how to handle bad days - journal feelings and what helps to feel better or worse - spend time or talk with others at least 2 to 3 times per week - spend time or talk with others every day - watch for early signs of feeling worse - write in journal every day - begin personal counseling - call and visit an old friend - check out volunteer opportunities - join a support group - laugh; watch a funny movie or comedian - learn and use visualization or guided imagery - perform a random act of kindness -  practice relaxation or meditation daily - start or continue a personal journal - talk about feelings with a friend, family or spiritual advisor - practice positive thinking and self-talk I have placed a referral to [facility]       Follow up:  Patient agrees to Care Plan and Follow-up.  Plan: The Managed Medicaid care management team will reach out to the patient again over the next 30 days.  Date/time of next scheduled Social Work care management/care coordination outreach:  05/25/21  [name], [place]a Miss, [place] Managed Medicaid Team  [phone]

## 2021-05-21 ENCOUNTER — Encounter: Payer: Self-pay | Admitting: Dermatology

## 2021-05-21 ENCOUNTER — Ambulatory Visit (INDEPENDENT_AMBULATORY_CARE_PROVIDER_SITE_OTHER): Payer: Medicaid Other | Admitting: Dermatology

## 2021-05-21 ENCOUNTER — Other Ambulatory Visit: Payer: Self-pay

## 2021-05-21 DIAGNOSIS — B360 Pityriasis versicolor: Secondary | ICD-10-CM | POA: Diagnosis not present

## 2021-05-21 DIAGNOSIS — D225 Melanocytic nevi of trunk: Secondary | ICD-10-CM | POA: Diagnosis not present

## 2021-05-21 DIAGNOSIS — D492 Neoplasm of unspecified behavior of bone, soft tissue, and skin: Secondary | ICD-10-CM

## 2021-05-21 MED ORDER — KETOCONAZOLE 2 % EX SHAM: 1.0000 "application " | MEDICATED_SHAMPOO | CUTANEOUS | 4 refills | Status: DC

## 2021-05-21 NOTE — Patient Instructions (Addendum)
If you have any questions or concerns for your doctor, please call our main line at [phone] and press option 4 to reach your doctor's medical assistant. If no one answers, please leave a voicemail as directed and we will return your call as soon as possible. Messages left after 4 pm will be answered the following business day.   You may also send us a message via MyChart. We typically respond to MyChart messages within 1-2 business days.  For prescription refills, please ask your pharmacy to contact our office. Our fax number is [phone].  If you have an urgent issue when the clinic is closed that cannot wait until the next business day, you can page your doctor at the number below.    Please note that while we do our best to be available for urgent issues outside of office hours, we are not available 24/7.   If you have an urgent issue and are unable to reach us, you may choose to seek medical care at your doctor's office, retail clinic, urgent care center, or emergency room.  If you have a medical emergency, please immediately call 911 or go to the emergency department.  Pager Numbers  - Dr. Kowalski: [phone]  - Dr. Moye: [phone]  - Dr. Stewart: [phone]  In the event of inclement weather, please call our main line at [phone] for an update on the status of any delays or closures.  Dermatology Medication Tips: Please keep the boxes that topical medications come in in order to help keep track of the instructions about where and how to use these. Pharmacies typically print the medication instructions only on the boxes and not directly on the medication tubes.   If your medication is too expensive, please contact our office at [phone] option 4 or send us a message through MyChart.   We are unable to tell what your co-pay for medications will be in advance as this is different depending on your insurance coverage. However, we may be able to find a substitute  medication at lower cost or fill out paperwork to get insurance to cover a needed medication.   If a prior authorization is required to get your medication covered by your insurance company, please allow us 1-2 business days to complete this process.  Drug prices often vary depending on where the prescription is filled and some pharmacies may offer cheaper prices.  The website www.goodrx.com contains coupons for medications through different pharmacies. The prices here do not account for what the cost may be with help from insurance (it may be cheaper with your insurance), but the website can give you the price if you did not use any insurance.  - You can print the associated coupon and take it with your prescription to the pharmacy.  - You may also stop by our office during regular business hours and pick up a GoodRx coupon card.  - If you need your prescription sent electronically to a different pharmacy, notify our office through [facility] MyChart or by phone at [phone] option 4.   Wound Care Instructions  Cleanse wound gently with soap and water once a day then pat dry with clean gauze. Apply a thing coat of Petrolatum (petroleum jelly, "Vaseline") over the wound (unless you have an allergy to this). We recommend that you use a new, sterile tube of Vaseline. Do not pick or remove scabs. Do not remove the yellow or white "healing tissue" from the base of the wound.  Cover the   wound with fresh, clean, nonstick gauze and secure with paper tape. You may use Band-Aids in place of gauze and tape if the would is small enough, but would recommend trimming much of the tape off as there is often too much. Sometimes Band-Aids can irritate the skin.  You should call the office for your biopsy report after 1 week if you have not already been contacted.  If you experience any problems, such as abnormal amounts of bleeding, swelling, significant bruising, significant pain, or evidence of infection,  please call the office immediately.  FOR ADULT SURGERY PATIENTS: If you need something for pain relief you may take 1 extra strength Tylenol (acetaminophen) AND 2 Ibuprofen (200mg each) together every 4 hours as needed for pain. (do not take these if you are allergic to them or if you have a reason you should not take them.) Typically, you may only need pain medication for 1 to 3 days.    

## 2021-05-21 NOTE — Progress Notes (Signed)
New Patient Visit  Subjective  Teresa [name] is a 20 y.o. female who presents for the following: Nevus (Back, has had for years, and grew when pregnant) and dry spots (Arms, 73m, itchy, uses eczema lotion).  New patient referral from Dr. Rory Percy  The following portions of the chart were reviewed this encounter and updated as appropriate:   Tobacco  Allergies  Meds  Problems  Med Hx  Surg Hx  Fam Hx     Review of Systems:  No other skin or systemic complaints except as noted in HPI or Assessment and Plan.  Objective  Well appearing patient in no apparent distress; mood and affect are within normal limits.  A focused examination was performed including back, arms. Relevant physical exam findings are noted in the Assessment and Plan.  Upper back spinal superior 1.1cm brown pap      Upper back spinal inferior 0.7cm brown pap   Assessment & Plan  Neoplasm of skin (2) Upper back spinal superior  Epidermal / dermal shaving  Lesion diameter (cm):  1.1 Informed consent: discussed and consent obtained   Timeout: patient name, date of birth, surgical site, and procedure verified   Procedure prep:  Patient was prepped and draped in usual sterile fashion Prep type:  Isopropyl alcohol Anesthesia: the lesion was anesthetized in a standard fashion   Anesthetic:  1% lidocaine w/ epinephrine 1-100,000 buffered w/ 8.4% NaHCO3 Instrument used: scissors   Hemostasis achieved with: pressure, aluminum chloride and electrodesiccation   Outcome: patient tolerated procedure well   Post-procedure details: sterile dressing applied and wound care instructions given   Dressing type: bandage and bacitracin    Specimen 1 - Surgical pathology Differential Diagnosis: D48.5 Irritated Nevus r/o Atypia  Check Margins: No 1.1cm brown pap  Upper back spinal inferior  Epidermal / dermal shaving  Lesion diameter (cm):  0.7 Informed consent: discussed and consent obtained   Timeout:  patient name, date of birth, surgical site, and procedure verified   Procedure prep:  Patient was prepped and draped in usual sterile fashion Prep type:  Isopropyl alcohol Anesthesia: the lesion was anesthetized in a standard fashion   Anesthetic:  1% lidocaine w/ epinephrine 1-100,000 buffered w/ 8.4% NaHCO3 Instrument used: scissors   Hemostasis achieved with: pressure, aluminum chloride and electrodesiccation   Outcome: patient tolerated procedure well   Post-procedure details: sterile dressing applied and wound care instructions given   Dressing type: bandage and bacitracin    Specimen 2 - Surgical pathology Differential Diagnosis: D48.5 Irriated Nevus r/o Atypia  Check Margins: No 0.7cm brown pap  Tinea versicolor trunk, arms, legs Tinea versicolor is a chronic recurrent skin rash causing discolored scaly spots most commonly seen on back, chest, and/or shoulders.  It is generally asymptomatic. The rash is due to overgrowth of a common type of yeast present on everyone's skin and it is not contagious.  It tends to flare more in the summer due to increased sweating on trunk.  After rash is treated, the scaliness will resolve, but the discoloration will take longer to return to normal pigmentation. The periodic use of an OTC medicated soap/shampoo with zinc or selenium sulfide can be helpful to prevent yeast overgrowth and recurrence.  Start Ketoconazole 2% shampoo 3x/wk, apply to body, scalp, let sit 5 minutes and rinse off  ketoconazole (NIZORAL) 2 % shampoo - trunk, arms, legs Apply 1 application topically 3 (three) times a week. Apply to scalp, arms, trunk, legs 3 times weekly for 3  months, then once monthly, let sit 5 minutes and rinse off  Return if symptoms worsen or fail to improve.  I, Othelia Pulling, RMA, am acting as scribe for Sarina Ser, MD . Documentation: I have reviewed the above documentation for accuracy and completeness, and I agree with the above.  Sarina Ser, MD

## 2021-05-27 ENCOUNTER — Telehealth: Payer: Self-pay

## 2021-05-27 NOTE — Telephone Encounter (Signed)
-----   Message from Ralene Bathe, MD sent at 05/26/2021  7:07 PM EDT ----- Diagnosis 1. Skin , upper back spinal superior MELANOCYTIC NEVUS, COMPOUND TYPE, BASE INVOLVED 2. Skin , upper back spinal inferior MELANOCYTIC NEVUS, COMPOUND TYPE, BASE INVOLVED  1&2 - both benign moles No further treatment needed

## 2021-05-27 NOTE — Telephone Encounter (Signed)
Left message on voicemail to return my all.

## 2021-05-28 ENCOUNTER — Telehealth: Payer: Self-pay

## 2021-05-28 NOTE — Telephone Encounter (Signed)
Left message for pt to return call for BX results.

## 2021-05-28 NOTE — Telephone Encounter (Signed)
-----   Message from Ralene Bathe, MD sent at 05/26/2021  7:07 PM EDT ----- Diagnosis 1. Skin , upper back spinal superior MELANOCYTIC NEVUS, COMPOUND TYPE, BASE INVOLVED 2. Skin , upper back spinal inferior MELANOCYTIC NEVUS, COMPOUND TYPE, BASE INVOLVED  1&2 - both benign moles No further treatment needed

## 2021-06-03 ENCOUNTER — Telehealth: Payer: Self-pay

## 2021-06-03 NOTE — Telephone Encounter (Signed)
-----   Message from Ralene Bathe, MD sent at 05/26/2021  7:07 PM EDT ----- Diagnosis 1. Skin , upper back spinal superior MELANOCYTIC NEVUS, COMPOUND TYPE, BASE INVOLVED 2. Skin , upper back spinal inferior MELANOCYTIC NEVUS, COMPOUND TYPE, BASE INVOLVED  1&2 - both benign moles No further treatment needed

## 2021-06-03 NOTE — Telephone Encounter (Signed)
Advised pt of bx results/sh ?

## 2021-07-20 ENCOUNTER — Ambulatory Visit: Payer: Medicaid Other | Admitting: Obstetrics & Gynecology

## 2021-07-22 ENCOUNTER — Ambulatory Visit: Payer: Medicaid Other | Admitting: Obstetrics and Gynecology

## 2021-07-22 NOTE — Progress Notes (Deleted)
PCP:  Steele Sizer, MD   No chief complaint on file.    HPI:      Ms. Teresa [name] is a 20 y.o. G1P1001 whose LMP was No LMP recorded., presents today for her annual examination.  Her menses are {norm/abn:715}, lasting {number: 22536} days.  Dysmenorrhea {dysmen:716}. She {does:18564} have intermenstrual bleeding.  Sex activity: {sex active: 315163}.  Last Pap: {TIWP:[id number]}  Results were: {norm/abn:16707::"no abnormalities"} /neg HPV DNA *** Hx of STDs: {STD hx:14358}  Last mammogram: {date:304500300}  Results were: {norm/abn:13465} There is no FH of breast cancer. There is no FH of ovarian cancer. The patient {does:18564} do self-breast exams.  Tobacco use: {tob:20664} Alcohol use: {Alcohol:11675} No drug use.  Exercise: {exercise:31265}  She {does:18564} get adequate calcium and Vitamin D in her diet.  Patient Active Problem List   Diagnosis Date Noted   Enlarged skin mole 03/11/2021   Encounter for care or examination of lactating mother 12/31/2020   Postpartum care following vaginal delivery 12/31/2020   Encounter for elective induction of labor 12/29/2020   [redacted] weeks gestation of pregnancy 12/29/2020   Third degree perineal laceration during delivery with less than 50% tear of external anal sphincter 12/29/2020   Supervision of low-risk first pregnancy 05/14/2020   Generalized anxiety disorder 02/12/2020   Postpartum depression 02/12/2020   History of suicide attempt 06/01/2016   MDD (major depressive disorder), recurrent episode, moderate ([place]) 05/31/2016   History of ankle sprain 09/23/2015   AD (atopic dermatitis) 05/12/2015   Elevated TSH 05/12/2015   History of chlamydia infection 05/12/2015    Past Surgical History:  Procedure Laterality Date   DENTAL SURGERY  2019   Dental implant    Family History  Problem Relation Age of Onset   Obesity Mother     Social History   Socioeconomic History   Marital status: Single    Spouse name: Not on  file   Number of children: Not on file   Years of education: Not on file   Highest education level: Not on file  Occupational History   Not on file  Tobacco Use   Smoking status: Former    Types: Cigarettes    Quit date: 2021    Years since quitting: 1.9   Smokeless tobacco: Never  Vaping Use   Vaping Use: Former  Substance and Sexual Activity   Alcohol use: No    Alcohol/week: 0.0 standard drinks   Drug use: Not Currently    Frequency: 7.0 times per week    Types: Marijuana    Comment: smoking    Sexual activity: Yes    Partners: Male    Birth control/protection: Condom  Other Topics Concern   Not on file  Social History Narrative   Not on file   Social Determinants of Health   Financial Resource Strain: Not on file  Food Insecurity: Not on file  Transportation Needs: Not on file  Physical Activity: Not on file  Stress: Not on file  Social Connections: Not on file  Intimate Partner Violence: Not on file     Current Outpatient Medications:    clotrimazole-betamethasone (LOTRISONE) cream, Apply 1 application topically 2 (two) times daily. (Patient not taking: No sig reported), Disp: 30 g, Rfl: 0   ketoconazole (NIZORAL) 2 % shampoo, Apply 1 application topically 3 (three) times a week. Apply to scalp, arms, trunk, legs 3 times weekly for 3 months, then once monthly, let sit 5 minutes and rinse off, Disp: 120 mL, Rfl: 4  ROS:  Review of Systems BREAST: No symptoms   Objective: There were no vitals taken for this visit.   OBGyn Exam  Results: No results found for this or any previous visit (from the past 24 hour(s)).  Assessment/Plan: No diagnosis found.  No orders of the defined types were placed in this encounter.            GYN counsel {counseling: 16159}     F/U  No follow-ups on file.  Kerline Trahan B. Tashe Purdon, PA-C 07/22/2021 11:22 AM

## 2021-11-03 ENCOUNTER — Ambulatory Visit: Payer: Self-pay

## 2021-11-03 ENCOUNTER — Encounter: Payer: Self-pay | Admitting: Internal Medicine

## 2021-11-03 ENCOUNTER — Ambulatory Visit (INDEPENDENT_AMBULATORY_CARE_PROVIDER_SITE_OTHER): Payer: Medicaid Other | Admitting: Internal Medicine

## 2021-11-03 VITALS — BP 112/62 | HR 115 | Temp 98.0°F | Resp 16 | Ht 66.0 in | Wt 197.0 lb

## 2021-11-03 DIAGNOSIS — R0981 Nasal congestion: Secondary | ICD-10-CM | POA: Diagnosis not present

## 2021-11-03 DIAGNOSIS — J029 Acute pharyngitis, unspecified: Secondary | ICD-10-CM | POA: Diagnosis not present

## 2021-11-03 DIAGNOSIS — J069 Acute upper respiratory infection, unspecified: Secondary | ICD-10-CM | POA: Diagnosis not present

## 2021-11-03 MED ORDER — FLUTICASONE PROPIONATE 50 MCG/ACT NA SUSP: 2.0000 | Freq: Every day | NASAL | 6 refills | Status: DC

## 2021-11-03 NOTE — Telephone Encounter (Signed)
?  Chief Complaint: difficulty swallowing ?Symptoms: Difficulty swallowing nasal congestion and drainage ?Frequency: Since 3/17 ?Pertinent Negatives: Patient denies fever, SOB, extreme pain ?Disposition: '[]'$ ED /'[]'$ Urgent Care (no appt availability in office) / '[x]'$ Appointment(In office/virtual)/ '[]'$  [facility]/ '[]'$ Home Care/ '[]'$ Refused Recommended Disposition /'[]'$ [facility]/ '[]'$  Follow-up with PCP ?Additional Notes: Pt is scheduled this afternoon.   ? ?Reason for Disposition ? [1] Sore throat with cough/cold symptoms AND [2] present > 5 days ? ?Answer Assessment - Initial Assessment Questions ?1. ONSET: "When did the throat start hurting?" (Hours or days ago)  ?    3/17 ?2. SEVERITY: "How bad is the sore throat?" (Scale 1-10; mild, moderate or severe) ?  - MILD (1-3):  doesn't interfere with eating or normal activities ?  - MODERATE (4-7): interferes with eating some solids and normal activities ?  - SEVERE (8-10):  excruciating pain, interferes with most normal activities ?  - SEVERE DYSPHAGIA: can't swallow liquids, drooling ?    mild ?3. STREP EXPOSURE: "Has there been any exposure to strep within the past week?" If Yes, ask: "What type of contact occurred?"  ?    no ?4.  VIRAL SYMPTOMS: "Are there any symptoms of a cold, such as a runny nose, cough, hoarse voice or red eyes?"  ?    Runny nose - green mucus ?5. FEVER: "Do you have a fever?" If Yes, ask: "What is your temperature, how was it measured, and when did it start?" ?    no ?6. PUS ON THE TONSILS: "Is there pus on the tonsils in the back of your throat?" ?    No - red ?7. OTHER SYMPTOMS: "Do you have any other symptoms?" (e.g., difficulty breathing, headache, rash) ?    no ?8. PREGNANCY: "Is there any chance you are pregnant?" "When was your last menstrual period?" ?    no ? ?Protocols used: Sore Throat-A-AH ? ?

## 2021-11-03 NOTE — Progress Notes (Signed)
? ?Acute Office Visit ? ?Subjective:  ? ? Patient ID: Teresa [name], female    DOB: [birth year], 21 y.o.   MRN: [id number] ? ?Chief Complaint  ?Patient presents with  ? URI  ?  Sore throat, congestion, cough, green snot and swollen lymph nodes  ? ? ?HPI ?Patient is in today for swollen lymph nodes, congestion, cough. Symptoms started 11 days ago. First started with cervical lymphadenopathy non-painful and getting better.  ? ?URI Compliant:  ? ?-Fever: no ?-Cough: yes productive, green ?-Shortness of breath: no ?-Wheezing:  mild ?-Chest congestion: yes ?-Nasal congestion: yes ?-Runny nose: yes, green and thick  ?-Post nasal drip: no ?-Sneezing: yes ?-Sore throat: no ?-Swollen glands: yes ?-Sinus pressure: no ?-Headache: no ?-Face pain: no ?-Ear pain: no    ?-Ear pressure: no  ?-Vomiting: no ?-Sick contacts: no ?-Context: better ?-Treatments attempted:  tylenol and Claritin    ? ? ?Past Medical History:  ?Diagnosis Date  ? Elevated TSH   ? History of chicken pox   ? Mass of left ovary   ? Moderate depressive episode   ? ? ?Past Surgical History:  ?Procedure Laterality Date  ? DENTAL SURGERY  2019  ? Dental implant  ? ? ?Family History  ?Problem Relation Age of Onset  ? Obesity Mother   ? ? ?Social History  ? ?Socioeconomic History  ? Marital status: Single  ?  Spouse name: Not on file  ? Number of children: Not on file  ? Years of education: Not on file  ? Highest education level: Not on file  ?Occupational History  ? Not on file  ?Tobacco Use  ? Smoking status: Former  ?  Types: Cigarettes  ?  Quit date: 2021  ?  Years since quitting: 2.2  ? Smokeless tobacco: Never  ?Vaping Use  ? Vaping Use: Former  ?Substance and Sexual Activity  ? Alcohol use: No  ?  Alcohol/week: 0.0 standard drinks  ? Drug use: Not Currently  ?  Frequency: 7.0 times per week  ?  Types: Marijuana  ?  Comment: smoking   ? Sexual activity: Yes  ?  Partners: Male  ?  Birth control/protection: Condom  ?Other Topics Concern  ? Not on file  ?Social  History Narrative  ? Not on file  ? ?Social Determinants of Health  ? ?Financial Resource Strain: Not on file  ?Food Insecurity: Not on file  ?Transportation Needs: Not on file  ?Physical Activity: Not on file  ?Stress: Not on file  ?Social Connections: Not on file  ?Intimate Partner Violence: Not on file  ? ? ?Outpatient Medications Prior to Visit  ?Medication Sig Dispense Refill  ? clotrimazole-betamethasone (LOTRISONE) cream Apply 1 application topically 2 (two) times daily. (Patient not taking: No sig reported) 30 g 0  ? ketoconazole (NIZORAL) 2 % shampoo Apply 1 application topically 3 (three) times a week. Apply to scalp, arms, trunk, legs 3 times weekly for 3 months, then once monthly, let sit 5 minutes and rinse off 120 mL 4  ? ?No facility-administered medications prior to visit.  ? ? ?No Known Allergies ? ?Review of Systems  ?Constitutional:  Negative for chills and fever.  ?HENT:  Positive for congestion, rhinorrhea and sore throat.   ?Respiratory:  Positive for cough. Negative for shortness of breath.   ?Cardiovascular:  Negative for chest pain.  ?Gastrointestinal:  Negative for abdominal pain, diarrhea, nausea and vomiting.  ?Neurological:  Negative for dizziness and headaches.  ? ?   ?  Objective:  ?  ?Physical Exam ?Constitutional:   ?   Appearance: Normal appearance.  ?HENT:  ?   Head: Normocephalic and atraumatic.  ?   Right Ear: Tympanic membrane, ear canal and external ear normal.  ?   Left Ear: Tympanic membrane, ear canal and external ear normal.  ?   Nose: Congestion present.  ?   Mouth/Throat:  ?   Mouth: Mucous membranes are moist.  ?   Pharynx: Oropharynx is clear.  ?   Comments: Enlarged tonsils bilaterally, some erythema  ?Eyes:  ?   Conjunctiva/sclera: Conjunctivae normal.  ?Cardiovascular:  ?   Rate and Rhythm: Normal rate and regular rhythm.  ?Pulmonary:  ?   Effort: Pulmonary effort is normal.  ?   Breath sounds: Normal breath sounds.  ?Musculoskeletal:  ?   Right lower leg: No edema.  ?    Left lower leg: No edema.  ?Lymphadenopathy:  ?   Cervical: Cervical adenopathy present.  ?Skin: ?   General: Skin is warm and dry.  ?Neurological:  ?   General: No focal deficit present.  ?   Mental Status: She is alert. Mental status is at baseline.  ?Psychiatric:     ?   Mood and Affect: Mood normal.     ?   Behavior: Behavior normal.  ? ? ?BP 112/62   Pulse (!) 115   Temp 98 ?F (36.7 ?C)   Resp 16   Ht '5\' 6"'$  (1.676 m)   Wt 197 lb (89.4 kg)   SpO2 98%   BMI 31.80 kg/m?  ?Wt Readings from Last 3 Encounters:  ?03/11/21 175 lb 3.2 oz (79.5 kg)  ?02/12/21 161 lb (73 kg)  ?01/27/21 170 lb (77.1 kg)  ? ? ?Health Maintenance Due  ?Topic Date Due  ? COVID-19 Vaccine (3 - Booster for Janssen series) 07/18/2020  ? INFLUENZA VACCINE  Never done  ? PAP-Cervical Cytology Screening  Never done  ? PAP SMEAR-Modifier  Never done  ? ? ?There are no preventive care reminders to display for this patient. ? ? ?Lab Results  ?Component Value Date  ? TSH 2.670 05/14/2020  ? ?Lab Results  ?Component Value Date  ? WBC 12.0 (H) 12/31/2020  ? HGB 9.6 (L) 12/31/2020  ? HCT 28.2 (L) 12/31/2020  ? MCV 90.7 12/31/2020  ? PLT 154 12/31/2020  ? ?Lab Results  ?Component Value Date  ? NA 133 (L) 12/31/2020  ? K 3.3 (L) 12/31/2020  ? CO2 19 (L) 12/31/2020  ? GLUCOSE 110 (H) 12/31/2020  ? BUN 11 12/31/2020  ? CREATININE 0.74 12/31/2020  ? BILITOT 0.6 12/31/2020  ? ALKPHOS 118 12/31/2020  ? AST 24 12/31/2020  ? ALT 15 12/31/2020  ? PROT 6.4 (L) 12/31/2020  ? ALBUMIN 2.8 (L) 12/31/2020  ? CALCIUM 8.1 (L) 12/31/2020  ? ANIONGAP 11 12/31/2020  ? ?Lab Results  ?Component Value Date  ? CHOL 175 (H) 06/02/2016  ? ?Lab Results  ?Component Value Date  ? HDL 71 06/02/2016  ? ?Lab Results  ?Component Value Date  ? [place] 84 06/02/2016  ? ?Lab Results  ?Component Value Date  ? TRIG 98 06/02/2016  ? ?Lab Results  ?Component Value Date  ? CHOLHDL 2.5 06/02/2016  ? ?Lab Results  ?Component Value Date  ? HGBA1C 4.8 06/02/2016  ? ? ?   ?Assessment & Plan:   ? ?1. Upper respiratory tract infection, unspecified type/Sore throat/Nasal congestion: Rapid strep negative, throat culture pending. Symptoms resolving slowly over time, most  likely viral in nature. Treat conservatively with nasal steroid, continue anti-histamine. Follow up if symptoms worsen or fail to improve.  ? ?- Culture, Group A Strep ?- fluticasone (FLONASE) 50 MCG/ACT nasal spray; Place 2 sprays into both nostrils daily.  Dispense: 16 g; Refill: 6 ? ? ?Teodora Medici, DO ? ?

## 2021-11-03 NOTE — Patient Instructions (Addendum)
It was great seeing you today! ? ?Plan discussed at today's visit: ?-Strep negative, throat culture pending  ?-Recommend using Flonase for symptoms, continue anti-histamine and decongestants as needed  ? ?Follow up in: as needed  ? ?Take care and let us know if you have any questions or concerns prior to your next visit. ? ?Dr. Rosana Berger ? ?

## 2021-11-05 LAB — CULTURE, GROUP A STREP
MICRO NUMBER:: 13195227
SPECIMEN QUALITY:: ADEQUATE

## 2022-01-08 ENCOUNTER — Ambulatory Visit: Payer: Medicaid Other | Admitting: Obstetrics

## 2022-01-18 ENCOUNTER — Ambulatory Visit: Payer: Medicaid Other | Admitting: Obstetrics

## 2022-01-21 ENCOUNTER — Telehealth (INDEPENDENT_AMBULATORY_CARE_PROVIDER_SITE_OTHER): Payer: Medicaid Other | Admitting: Family Medicine

## 2022-01-21 DIAGNOSIS — S52124A Nondisplaced fracture of head of right radius, initial encounter for closed fracture: Secondary | ICD-10-CM

## 2022-01-21 DIAGNOSIS — M25521 Pain in right elbow: Secondary | ICD-10-CM | POA: Diagnosis not present

## 2022-01-21 NOTE — Progress Notes (Unsigned)
Name: Teresa [name]   MRN: [id number]    DOB: Jan 31, 2001   Date:01/21/2022       Progress Note  Subjective:    Chief Complaint  Chief Complaint  Patient presents with   Follow-up   Elbow Injury    Pt was seen at [facility] for elbow injury, was told to follow-up with [facility]. Pt did follow up with them but only got 3 days of pain medication. Pt states pain is still severe and would like some pain mediation that can help her. Pt unable to give me vital signs    I connected with  Teresa [name]  on 01/21/22 at  1:20 PM EDT by a video enabled telemedicine application and verified that I am speaking with the correct person using two identifiers.  I discussed the limitations of evaluation and management by telemedicine and the availability of in person appointments. The patient expressed understanding and agreed to proceed. Staff also discussed with the patient that there may be a patient responsible charge related to this service. Patient Location: home Provider Location: [facility] Additional Individuals present: none   HPI  Right elbow pain, injury Sunday night (4 d ago), she went to [facility] Monday, told she had a fx and then Tuesday saw [facility] and got more xrays - told she had 2 more fx she is in a sling, was given #12 norco per UC, and diclofenac TID, she is taking but still having a lot of pain she has f/up with ortho in 2 weeks. She is alone taking care of her toddler, everything hurts even though she is trying not to use her right arm or wrist No worsening pain, numbness, pallor, erythema  Patient Active Problem List   Diagnosis Date Noted   Enlarged skin mole 03/11/2021   Encounter for care or examination of lactating mother 12/31/2020   Postpartum care following vaginal delivery 12/31/2020   Encounter for elective induction of labor 12/29/2020   [redacted] weeks gestation of pregnancy 12/29/2020   Third degree perineal laceration during delivery with less than 50% tear of external anal  sphincter 12/29/2020   Supervision of low-risk first pregnancy 05/14/2020   Generalized anxiety disorder 02/12/2020   Postpartum depression 02/12/2020   History of suicide attempt 06/01/2016   MDD (major depressive disorder), recurrent episode, moderate ([place]) 05/31/2016   History of ankle sprain 09/23/2015   AD (atopic dermatitis) 05/12/2015   Elevated TSH 05/12/2015   History of chlamydia infection 05/12/2015    Social History   Tobacco Use   Smoking status: Former    Types: Cigarettes    Quit date: 2021    Years since quitting: 2.4   Smokeless tobacco: Never  Substance Use Topics   Alcohol use: No    Alcohol/week: 0.0 standard drinks of alcohol     Current Outpatient Medications:    clotrimazole-betamethasone (LOTRISONE) cream, Apply 1 application topically 2 (two) times daily. (Patient not taking: Reported on 02/12/2021), Disp: 30 g, Rfl: 0   fluticasone (FLONASE) 50 MCG/ACT nasal spray, Place 2 sprays into both nostrils daily. (Patient not taking: Reported on 01/21/2022), Disp: 16 g, Rfl: 6   ketoconazole (NIZORAL) 2 % shampoo, Apply 1 application topically 3 (three) times a week. Apply to scalp, arms, trunk, legs 3 times weekly for 3 months, then once monthly, let sit 5 minutes and rinse off (Patient not taking: Reported on 01/21/2022), Disp: 120 mL, Rfl: 4  No Known Allergies  I personally reviewed active problem list, medication  list, allergies, family history, social history, health maintenance, notes from last encounter, lab results, imaging with the patient/caregiver today. [facility] records reviewed, and other records requested  Review of Systems  Constitutional: Negative.   HENT: Negative.    Eyes: Negative.   Respiratory: Negative.    Cardiovascular: Negative.   Gastrointestinal: Negative.   Endocrine: Negative.   Genitourinary: Negative.   Musculoskeletal: Negative.   Skin: Negative.   Allergic/Immunologic: Negative.   Neurological: Negative.   Hematological:  Negative.   Psychiatric/Behavioral: Negative.    All other systems reviewed and are negative.     Objective:   Virtual encounter, vitals limited, only able to obtain the following There were no vitals filed for this visit. There is no height or weight on file to calculate BMI. Nursing Note and Vital Signs reviewed.  VS from 01/18/22 Vitals:  01/18/22 1048  BP: 110/74  Pulse: 84  Resp: 19  Temp: 36.8 C (98.3 F)  TempSrc: Tympanic  SpO2: 98%  Weight: 89.5 kg  Height: '5\' 6"'$      Physical Exam Vitals reviewed.  Constitutional:      General: She is not in acute distress.    Appearance: Normal appearance. She is well-developed and well-groomed.  Pulmonary:     Effort: No respiratory distress.  Neurological:     Mental Status: She is alert.  Psychiatric:        Mood and Affect: Mood normal.        Behavior: Behavior is cooperative.     PE limited by virtual encounter  No results found for this or any previous visit (from the past 72 hour(s)).  Assessment and Plan:     ICD-10-CM   1. Right elbow pain  M25.521    encouraged her to f/up with managing specialists for pain control and certainly have them rechck with fx and swelling    2. Closed nondisplaced fracture of head of right radius, initial encounter  S52.124A    dx per xray in med records - getting records from ortho - likely more detail in their imaging     Other pain control - encouraged ice, rest, immobilization   I reviewed opioid med Rx laws and reviewed the controlled substance database Encouraged her to use Tylenol [phone] mg TID-QID prn and higher dose over-the-counter NSAIDs as directed   - I discussed the assessment and treatment plan with the patient. The patient was provided an opportunity to ask questions and all were answered. The patient agreed with the plan and demonstrated an understanding of the instructions.  I provided 10 minutes of non-face-to-face time during this encounter.  Delsa Grana, PA-C 01/21/22 1:29 PM

## 2022-02-01 ENCOUNTER — Encounter: Payer: Self-pay | Admitting: Obstetrics

## 2022-02-01 ENCOUNTER — Other Ambulatory Visit (HOSPITAL_COMMUNITY)
Admission: RE | Admit: 2022-02-01 | Discharge: 2022-02-01 | Disposition: A | Discharge status: Home / Self Care | Payer: Medicaid Other | Source: Ambulatory Visit | Attending: Obstetrics | Admitting: Obstetrics

## 2022-02-01 ENCOUNTER — Ambulatory Visit (INDEPENDENT_AMBULATORY_CARE_PROVIDER_SITE_OTHER): Payer: Medicaid Other | Admitting: Obstetrics

## 2022-02-01 VITALS — BP 118/80 | Ht 66.0 in | Wt 196.0 lb

## 2022-02-01 DIAGNOSIS — Z124 Encounter for screening for malignant neoplasm of cervix: Secondary | ICD-10-CM | POA: Insufficient documentation

## 2022-02-01 DIAGNOSIS — Z01419 Encounter for gynecological examination (general) (routine) without abnormal findings: Secondary | ICD-10-CM

## 2022-02-01 DIAGNOSIS — Z30430 Encounter for insertion of intrauterine contraceptive device: Secondary | ICD-10-CM | POA: Diagnosis not present

## 2022-02-01 DIAGNOSIS — Z113 Encounter for screening for infections with a predominantly sexual mode of transmission: Secondary | ICD-10-CM

## 2022-02-01 MED ORDER — MISOPROSTOL 200 MCG PO TABS: 200.0000 ug | ORAL_TABLET | Freq: Once | ORAL | 0 refills | Status: DC

## 2022-02-03 LAB — CERVICOVAGINAL ANCILLARY ONLY
Bacterial Vaginitis (gardnerella): NEGATIVE
Candida Glabrata: NEGATIVE
Candida Vaginitis: NEGATIVE
Chlamydia: NEGATIVE
Comment: NEGATIVE
Comment: NEGATIVE
Comment: NEGATIVE
Comment: NEGATIVE
Comment: NEGATIVE
Comment: NORMAL
Neisseria Gonorrhea: NEGATIVE
Trichomonas: NEGATIVE

## 2022-02-04 ENCOUNTER — Encounter: Payer: Self-pay | Admitting: Obstetrics

## 2022-02-04 LAB — CYTOLOGY - PAP: Diagnosis: NEGATIVE

## 2022-02-18 ENCOUNTER — Encounter: Payer: Self-pay | Admitting: Obstetrics

## 2022-02-18 ENCOUNTER — Ambulatory Visit: Payer: Medicaid Other | Admitting: Obstetrics

## 2022-02-18 ENCOUNTER — Other Ambulatory Visit (HOSPITAL_COMMUNITY)
Admission: RE | Admit: 2022-02-18 | Discharge: 2022-02-18 | Disposition: A | Discharge status: Home / Self Care | Payer: Medicaid Other | Source: Ambulatory Visit | Attending: Obstetrics | Admitting: Obstetrics

## 2022-02-18 VITALS — BP 120/80 | Ht 66.0 in | Wt 198.0 lb

## 2022-02-18 DIAGNOSIS — N9089 Other specified noninflammatory disorders of vulva and perineum: Secondary | ICD-10-CM

## 2022-02-18 DIAGNOSIS — N926 Irregular menstruation, unspecified: Secondary | ICD-10-CM | POA: Diagnosis present

## 2022-02-18 DIAGNOSIS — Z3202 Encounter for pregnancy test, result negative: Secondary | ICD-10-CM

## 2022-02-18 DIAGNOSIS — N912 Amenorrhea, unspecified: Secondary | ICD-10-CM | POA: Diagnosis not present

## 2022-02-18 LAB — POCT URINE PREGNANCY: Preg Test, Ur: NEGATIVE

## 2022-02-18 NOTE — Progress Notes (Signed)
Obstetrics & Gynecology Office Visit   Chief Complaint:  Chief Complaint  Patient presents with   skin tag removal     History of Present Illness: Teresa [name] was originally scheduled for IUD placement today, but her period is late, and she thus wants to wait for her menses to start and then have the IUD placed. She has had a negative pregnancy test today.  In the meantime,. She is requesting removal of a perineal skin tag. Located along her left groin area, near her panty line. This was noted at her recent appointment in June.   Review of Systems:  Review of Systems  Constitutional: Negative.   HENT: Negative.    Eyes: Negative.   Respiratory: Negative.    Cardiovascular: Negative.   Gastrointestinal: Negative.   Genitourinary: Negative.   Musculoskeletal: Negative.   Skin:        Small skin tag located at 3 O"clock on her left inner thigh. Other small boils, and skinn irritation noted on her mons and perineum secondary to recent wax job.  Neurological: Negative.   Psychiatric/Behavioral: Negative.       Past Medical History:  Past Medical History:  Diagnosis Date   Elevated TSH    History of chicken pox    Mass of left ovary    Moderate depressive episode    Postpartum care following vaginal delivery 12/31/2020   Postpartum depression 02/12/2020   Supervision of low-risk first pregnancy 05/14/2020    Nursing Staff Provider  Office Location  [facility] Dating   7wk [place]  Language  English Anatomy US  complete  Flu Vaccine   declined Genetic Screen  NIPS: normal xx  TDaP vaccine   12/09/20 Hgb A1C or  GTT Early : n/a Third trimester : 113  Covid  J&J   LAB RESULTS   Rhogam  Not needed Blood Type O/Positive/-- (10/06 1430)   Feeding Plan  Breast Antibody Negative (10/06 1430)  Contraception None/condo   Third degree perineal laceration during delivery with less than 50% tear of external anal sphincter 12/29/2020    Past Surgical History:  Past Surgical History:  Procedure Laterality  Date   DENTAL SURGERY  2019   Dental implant    Gynecologic History: Patient's last menstrual period was 01/07/2022 (approximate).  Obstetric History: G1P1001  Family History:  Family History  Problem Relation Age of Onset   Obesity Mother     Social History:  Social History   Socioeconomic History   Marital status: Single    Spouse name: Not on file   Number of children: Not on file   Years of education: Not on file   Highest education level: Not on file  Occupational History   Not on file  Tobacco Use   Smoking status: Former    Types: Cigarettes    Quit date: 2021    Years since quitting: 2.5   Smokeless tobacco: Never  Vaping Use   Vaping Use: Former  Substance and Sexual Activity   Alcohol use: No    Alcohol/week: 0.0 standard drinks of alcohol   Drug use: Not Currently    Frequency: 7.0 times per week    Types: Marijuana    Comment: smoking    Sexual activity: Yes    Partners: Male    Birth control/protection: Condom  Other Topics Concern   Not on file  Social History Narrative   Not on file   Social Determinants of Health   Financial Resource Strain: Not on  file  Food Insecurity: Not on file  Transportation Needs: Not on file  Physical Activity: Insufficiently Active (02/27/2018)   Exercise Vital Sign    Days of Exercise per Week: 3 days    Minutes of Exercise per Session: 30 min  Stress: No Stress Concern Present (02/27/2018)   [place]    Feeling of Stress : Not at all  Social Connections: Not on file  Intimate Partner Violence: Not on file    Allergies:  No Known Allergies  Medications: Prior to Admission medications   Medication Sig Start Date End Date Taking? Authorizing Provider  HYDROcodone-acetaminophen (NORCO/VICODIN) 5-325 MG tablet Take 1 tablet by mouth every 6 (six) hours as needed. 01/27/22  Yes [provider]  diclofenac (CATAFLAM) 50 MG tablet Take  50 mg by mouth 3 (three) times daily as needed. Patient not taking: Reported on 02/18/2022 01/18/22   [provider]  misoprostol (CYTOTEC) 200 MCG tablet Place 1 tablet (200 mcg total) vaginally once for 1 dose. At bedtime evening prior to procedure 02/01/22 02/01/22  Imagene Riches, CNM  traMADol (ULTRAM) 50 MG tablet Take 50 mg by mouth every 6 (six) hours as needed. Patient not taking: Reported on 02/18/2022 01/21/22   [provider]    Physical Exam Vitals:  Vitals:   02/18/22 1104  BP: 120/80   Patient's last menstrual period was 01/07/2022 (approximate).  Physical Exam Vitals reviewed.  Constitutional:      Appearance: Normal appearance. She is obese.  HENT:     Head: Normocephalic and atraumatic.  Cardiovascular:     Rate and Rhythm: Normal rate and regular rhythm.  Pulmonary:     Effort: Pulmonary effort is normal.     Breath sounds: Normal breath sounds.  Abdominal:     Palpations: Abdomen is soft.  Genitourinary:    General: Normal vulva.     Exam position: Lithotomy position.     Pubic Area: Rash present.     Rectum: Normal.       Comments: Mons area and along labia shows signs of recent waxing, with red, raised base of rmoved hair.  The skin tag is located at  3 O'clock, on her left inguinal area as marked in the diagram. Musculoskeletal:        General: Normal range of motion.     Cervical back: Normal range of motion and neck supple.  Skin:    General: Skin is warm and dry.  Neurological:     General: No focal deficit present.     Mental Status: She is alert and oriented to person, place, and time.  Psychiatric:        Mood and Affect: Mood normal.        Behavior: Behavior normal.      Assessment: 21 y.o. G1P1001 for removal of perineal skin tag (one tag)  Plan: Problem List Items Addressed This Visit   None Visit Diagnoses     Missed period    -  Primary   Relevant Orders   POCT urine pregnancy (Completed)   Surgical  pathology   Skin tag of female perineum          Procedural note:  The area around the skin tag was thoroughly cleaned and prepped with Betadine. The base of the tag was injected with .66ms of Lidocaine 1%. Using a scalpel, the skin tag was easily removed and the minimal bleeding controlled by application of silver nitrite.  A clean band aid was then applied. The patient tolerated the procedure well. As she was originally here for IUD placement, we reviewed the import of repeat pregnancy testing in several more days. Should her period start, she will contact the office for IUD appointment.She verbalizes understanding.  Follow up PRN.  Imagene Riches, CNM  02/18/2022 12:17 PM

## 2022-02-22 ENCOUNTER — Encounter: Payer: Self-pay | Admitting: Obstetrics

## 2022-02-22 LAB — SURGICAL PATHOLOGY

## 2022-03-01 ENCOUNTER — Telehealth: Payer: Self-pay | Admitting: Obstetrics & Gynecology

## 2022-03-01 NOTE — Telephone Encounter (Signed)
Patient is scheduled for mirena placement on 03/02/22 at 1:55 pm with [facility]

## 2022-03-02 ENCOUNTER — Ambulatory Visit: Payer: Medicaid Other | Admitting: Family Medicine

## 2022-03-16 ENCOUNTER — Ambulatory Visit: Payer: Medicaid Other | Admitting: Obstetrics and Gynecology

## 2022-03-16 ENCOUNTER — Encounter: Payer: Self-pay | Admitting: Obstetrics and Gynecology

## 2022-03-16 ENCOUNTER — Ambulatory Visit: Payer: Medicaid Other | Admitting: Obstetrics & Gynecology

## 2022-03-16 VITALS — BP 110/70 | Ht 66.0 in | Wt 199.0 lb

## 2022-03-16 DIAGNOSIS — Z30430 Encounter for insertion of intrauterine contraceptive device: Secondary | ICD-10-CM

## 2022-03-16 LAB — POCT URINE PREGNANCY: Preg Test, Ur: NEGATIVE

## 2022-03-16 MED ORDER — LEVONORGESTREL 20 MCG/DAY IU IUD
1.0000 | INTRAUTERINE_SYSTEM | Freq: Once | INTRAUTERINE | Status: AC
  Administered 2022-03-16: 1 via INTRAUTERINE

## 2022-03-16 MED ORDER — LEVONORGESTREL 20 MCG/DAY IU IUD: 1.0000 | INTRAUTERINE_SYSTEM | Freq: Once | INTRAUTERINE | 0 refills | Status: DC

## 2022-03-16 NOTE — Patient Instructions (Signed)
I value your feedback and you entrusting us with your care. If you get a [place] patient survey, I would appreciate you taking the time to let us know about your experience today. Thank you!  [facility] [phone]  Instructions after IUD insertion  Most women experience no significant problems after insertion of an IUD, however minor cramping and spotting for a few days is common. Cramps may be treated with ibuprofen 800mg every 8 hours or Tylenol 650 mg every 4 hours. Contact [facility] immediately if you experience any of the following symptoms during the next week: temperature >99.6 degrees, worsening pelvic pain, abdominal pain, fainting, unusually heavy vaginal bleeding, foul vaginal discharge, or if you think you have expelled the IUD.  Nothing inserted in the vagina for 48 hours. You will be scheduled for a follow up visit in approximately four weeks.  You should check monthly to be sure you can feel the IUD strings in the upper vagina. If you are having a monthly period, try to check after each period. If you cannot feel the IUD strings,  contact [facility] immediately so we can do an exam to determine if the IUD has been expelled.   Please use backup protection until we can confirm the IUD is in place.  Call [facility] if you are exposed to or diagnosed with a sexually transmitted infection, as we will need to discuss whether it is safe for you to continue using an IUD.   

## 2022-03-16 NOTE — Progress Notes (Signed)
   Chief Complaint  Patient presents with   Contraception    Mirena insertion     IUD PROCEDURE NOTE:  Teresa [name] is a 21 y.o. G1P1001 here for Mirena  IUD insertion for [facility]. Took cytotec today. Menses hasn't come yet but pt wants to go ahead and get on [facility]. Still breastfeeding but having monthly menses now. G1P1001. Neg pap 6/23  BP 110/70   Ht '5\' 6"'$  (1.676 m)   Wt 199 lb (90.3 kg)   LMP 02/16/2022 (Exact Date)   Breastfeeding Yes   BMI 32.12 kg/m   IUD Insertion Procedure Note Patient identified, informed consent performed, consent signed.   Discussed risks of irregular bleeding, cramping, infection, malpositioning or misplacement of the IUD outside the uterus which may require further procedure such as laparoscopy, risk of failure <1%. Time out was performed.  Urine pregnancy test negative.  Speculum placed in the vagina.  Cervix visualized.  Cleaned with Betadine x 2.  Grasped anteriorly with a single tooth tenaculum.  Uterus sounded to 8.0 cm.   IUD placed per manufacturer's recommendations.  Strings trimmed to 3 cm. Tenaculum was removed, good hemostasis noted.  Patient tolerated procedure well.   Results for orders placed or performed in visit on 03/16/22 (from the past 24 hour(s))  POCT urine pregnancy     Status: Normal   Collection Time: 03/16/22  4:14 PM  Result Value Ref Range   Preg Test, Ur Negative Negative     ASSESSMENT:  Encounter for IUD insertion - Plan: POCT urine pregnancy, levonorgestrel (MIRENA) 20 MCG/DAY IUD   Meds ordered this encounter  Medications   levonorgestrel (MIRENA) 20 MCG/DAY IUD    Sig: 1 each by Intrauterine route once for 1 dose.    Dispense:  1 each    Refill:  0     Plan:  Patient was given post-procedure instructions.  She was advised to have backup contraception for one week.   Call if you are having increasing pain, cramps or bleeding or if you have a fever greater than 100.4 degrees F., shaking chills, nausea or  vomiting. Patient was also asked to check IUD strings periodically and follow up in 4 weeks for IUD check.  Return in about 4 weeks (around 04/13/2022) for IUD f/u.  Amoni Morales B. Amor Packard, PA-C 03/16/2022 4:28 PM

## 2022-03-17 ENCOUNTER — Encounter: Payer: Self-pay | Admitting: Obstetrics

## 2022-03-18 ENCOUNTER — Encounter: Payer: Self-pay | Admitting: Obstetrics

## 2022-03-18 ENCOUNTER — Encounter: Payer: Self-pay | Admitting: Obstetrics and Gynecology

## 2022-03-25 ENCOUNTER — Ambulatory Visit: Payer: Medicaid Other | Admitting: Obstetrics

## 2022-03-29 ENCOUNTER — Telehealth: Payer: Self-pay

## 2022-03-29 NOTE — Telephone Encounter (Signed)
Patient contacted triage after hours with concerns of cramping and brown blood, she started in message that she is having to wear panty liner and states that Mirena was placed a week ago.

## 2022-03-29 NOTE — Telephone Encounter (Signed)
Attempted to contact patient to further triage and left message to call back. According to  triage protocol it states that bleeding can be irregular or prolonged up to 4-6 weeks after insertion. KW

## 2022-03-29 NOTE — Telephone Encounter (Signed)
Patient advised.KW 

## 2022-04-13 ENCOUNTER — Ambulatory Visit: Payer: Medicaid Other | Admitting: Obstetrics

## 2022-04-16 ENCOUNTER — Ambulatory Visit: Payer: Medicaid Other | Admitting: Obstetrics

## 2022-04-16 ENCOUNTER — Encounter: Payer: Self-pay | Admitting: Obstetrics

## 2022-04-16 VITALS — BP 120/80 | Ht 66.0 in | Wt 199.0 lb

## 2022-04-16 DIAGNOSIS — Z30431 Encounter for routine checking of intrauterine contraceptive device: Secondary | ICD-10-CM | POA: Diagnosis not present

## 2022-04-16 DIAGNOSIS — R21 Rash and other nonspecific skin eruption: Secondary | ICD-10-CM | POA: Diagnosis not present

## 2022-04-16 DIAGNOSIS — Z975 Presence of (intrauterine) contraceptive device: Secondary | ICD-10-CM | POA: Diagnosis not present

## 2022-04-16 NOTE — Progress Notes (Signed)
   IUD String Check  Subjctive: Ms. Teresa [name] presents for IUD string check.  She had a Mirena placed 4 weeks ago.  Since placement of her IUD she has had ongoing vaginal bleeding. She is unhappy with the daily vaginal bleeding. She admits to cramping or discomfort.  She has not had intercourse since placement.  She has not checked the strings.  She denies any fever, chills, nausea, vomiting, or other complaints.  Initially , she presented today for IUD removal. She has had the IUD only one month.  Objective: BP 120/80   Ht '5\' 6"'$  (1.676 m)   Wt 199 lb (90.3 kg)   BMI 32.12 kg/m  Physical Exam Constitutional:      Appearance: Normal appearance. She is obese.  Genitourinary:     Vulva and rectum normal.     Genitourinary Comments: She has red "bumps" in her mons hair, and along her groin/panti line that itches and is irritating. Spec exam reveals two IUD strings that protrude 2cms from the cervical os. No visible blood noted in the vagina.  Cardiovascular:     Rate and Rhythm: Normal rate and regular rhythm.  Pulmonary:     Effort: Pulmonary effort is normal.     Breath sounds: Normal breath sounds.  Abdominal:     Palpations: Abdomen is soft.  Neurological:     General: No focal deficit present.     Mental Status: She is alert and oriented to person, place, and time.  Skin:    General: Skin is warm and dry.  Psychiatric:        Mood and Affect: Mood normal.        Behavior: Behavior normal.     Female chaperone was present for the entirety of the pelvic exam  Assessment: 21 y.o. year old female status post prior Mirena IUD placement 4 week ago,  with complaints of bleeding and occasional cramping.  Plan: 1.  The patient was given instructions to check her IUD strings monthly and call with any problems or concerns.  She should call for fevers, chills, abnormal vaginal discharge, pelvic pain, or other complaints. 2.  We discussed her complaints, and I have encouraged her  to trial the IUD for another month. She is advised to use Advil  dosing to suppress the bleeding. We also discussed the option of one month's worth of low dosage OCPs should the spotting continue. I reviewed how the Mirena works to help the uterine lining shed and then for most women, the spotting will stop.  For her pubic "rash" she is going to use an antibacterial soap, and may try using some topical Monistat. She will f/u with [place] PRN for this complaint.  20 minutes spent in face to face discussion with > 50% spent in counseling, management, and coordination of care for her newly-placed IUD.  Risks and benefits of IUD discussed including the risks of irregular bleeding, cramping, infection, malpositioning, expulsion, which may require further procedures such as laparoscopy.  IUDs while effective at preventing pregnancy do not prevent transmission of sexually transmitted diseases and use of barrier methods for this purpose was discussed.  Low overall incidence of failure with 99.7% efficacy rate in typical use.    Imagene Riches, CNM  04/16/2022 12:24 PM   04/16/2022 12:16 PM

## 2022-05-26 ENCOUNTER — Ambulatory Visit: Payer: Medicaid Other | Admitting: Obstetrics

## 2022-07-13 ENCOUNTER — Ambulatory Visit (INDEPENDENT_AMBULATORY_CARE_PROVIDER_SITE_OTHER): Payer: Medicaid Other

## 2022-07-13 VITALS — BP 114/70 | HR 106 | Resp 16 | Wt 196.5 lb

## 2022-07-13 DIAGNOSIS — N3090 Cystitis, unspecified without hematuria: Secondary | ICD-10-CM | POA: Diagnosis not present

## 2022-07-13 LAB — POCT URINALYSIS DIPSTICK
Bilirubin, UA: NEGATIVE
Glucose, UA: NEGATIVE
Ketones, UA: NEGATIVE
Nitrite, UA: NEGATIVE
Protein, UA: NEGATIVE
Spec Grav, UA: 1.01 (ref 1.010–1.025)
Urobilinogen, UA: 0.2 E.U./dL
pH, UA: 6 (ref 5.0–8.0)

## 2022-07-13 MED ORDER — SULFAMETHOXAZOLE-TRIMETHOPRIM 800-160 MG PO TABS: 1.0000 | ORAL_TABLET | Freq: Two times a day (BID) | ORAL | 0 refills | Status: DC

## 2022-07-13 NOTE — Patient Instructions (Addendum)
Urinary Tract Infection, Adult A urinary tract infection (UTI) is an infection of any part of the urinary tract. The urinary tract includes: The kidneys. The ureters. The bladder. The urethra. These organs make, store, and get rid of pee (urine) in the body. What are the causes? This infection is caused by germs (bacteria) in your genital area. These germs grow and cause swelling (inflammation) of your urinary tract. What increases the risk? The following factors may make you more likely to develop this condition: Using a small, thin tube (catheter) to drain pee. Not being able to control when you pee or poop (incontinence). Being female. If you are female, these things can increase the risk: Using these methods to prevent pregnancy: A medicine that kills sperm (spermicide). A device that blocks sperm (diaphragm). Having low levels of a female hormone (estrogen). Being pregnant. You are more likely to develop this condition if: You have genes that add to your risk. You are sexually active. You take antibiotic medicines. You have trouble peeing because of: A prostate that is bigger than normal, if you are female. A blockage in the part of your body that drains pee from the bladder. A kidney stone. A nerve condition that affects your bladder. Not getting enough to drink. Not peeing often enough. You have other conditions, such as: Diabetes. A weak disease-fighting system (immune system). Sickle cell disease. Gout. Injury of the spine. What are the signs or symptoms? Symptoms of this condition include: Needing to pee right away. Peeing small amounts often. Pain or burning when peeing. Blood in the pee. Pee that smells bad or not like normal. Trouble peeing. Pee that is cloudy. Fluid coming from the vagina, if you are female. Pain in the belly or lower back. Other symptoms include: Vomiting. Not feeling hungry. Feeling mixed up (confused). This may be the first symptom in  older adults. Being tired and grouchy (irritable). A fever. Watery poop (diarrhea). How is this treated? Taking antibiotic medicine. Taking other medicines. Drinking enough water. In some cases, you may need to see a specialist. Follow these instructions at home:  Medicines Take over-the-counter and prescription medicines only as told by your doctor. If you were prescribed an antibiotic medicine, take it as told by your doctor. Do not stop taking it even if you start to feel better. General instructions Make sure you: Pee until your bladder is empty. Do not hold pee for a long time. Empty your bladder after sex. Wipe from front to back after peeing or pooping if you are a female. Use each tissue one time when you wipe. Drink enough fluid to keep your pee pale yellow. Keep all follow-up visits. Contact a doctor if: You do not get better after 1-2 days. Your symptoms go away and then come back. Get help right away if: You have very bad back pain. You have very bad pain in your lower belly. You have a fever. You have chills. You feeling like you will vomit or you vomit. Summary A urinary tract infection (UTI) is an infection of any part of the urinary tract. This condition is caused by germs in your genital area. There are many risk factors for a UTI. Treatment includes antibiotic medicines. Drink enough fluid to keep your pee pale yellow. This information is not intended to replace advice given to you by your health care provider. Make sure you discuss any questions you have with your health care provider. Document Revised: 03/07/2020 Document Reviewed: 03/07/2020 Elsevier Patient Education    [place].      Sulfamethoxazole; Trimethoprim Tablets What is this medication? SULFAMETHOXAZOLE; TRIMETHOPRIM (suhl fuh meth OK suh zohl; trye METH oh prim) treats infections caused by bacteria. It belongs to a group of medications called sulfonamide antibiotics. It will not  treat colds, the flu, or infections caused by viruses. This medicine may be used for other purposes; ask your health care provider or pharmacist if you have questions. COMMON BRAND NAME(S): Bacter-Aid DS, Bactrim, Bactrim DS, Septra, Septra DS What should I tell my care team before I take this medication? They need to know if you have any of these conditions: G6PD deficiency HIV or AIDS Kidney disease Liver disease Low platelet levels Low red blood cell levels Poor nutrition Stomach or intestine problems, such as colitis Thyroid disease An unusual or allergic reaction to sulfamethoxazole, trimethoprim, other medications, foods, dyes, or preservatives Pregnant or trying to get pregnant Breast-feeding How should I use this medication? Take this medication by mouth with a glass of water. Follow the directions on the prescription label. Take your medication at regular intervals. Do not take it more often than directed. Take all of your medication as directed even if you think you are better. Do not skip doses or stop your medication early. Talk to your care team about the use of this medication in children. While this medication may be prescribed for children as young as 2 months for selected conditions, precautions do apply. Overdosage: If you think you have taken too much of this medicine contact a poison control center or emergency room at once. NOTE: This medicine is only for you. Do not share this medicine with others. What if I miss a dose? If you miss a dose, take it as soon as you can. If it is almost time for your next dose, take only that dose. Do not take double or extra doses. What may interact with this medication? Do not take this medication with any of the following: Dofetilide This medication may also interact with the following: Amantadine Certain medications for blood pressure or heart disease Certain medications for depression, such as amitriptyline Certain medications  for diabetes, such as glipizide or glyburide Certain medications that treat or prevent blood clots, such as warfarin Cyclosporine Digoxin Diuretics Estrogen and progestin hormones Indomethacin Methotrexate Phenytoin Procainamide Pyrimethamine Zidovudine This list may not describe all possible interactions. Give your health care provider a list of all the medicines, herbs, non-prescription drugs, or dietary supplements you use. Also tell them if you smoke, drink alcohol, or use illegal drugs. Some items may interact with your medicine. What should I watch for while using this medication? Tell your care team if your symptoms do not start to get better or if they get worse. Do not treat diarrhea with over the counter products. Contact your care team if you have diarrhea that lasts more than 2 days or if it is severe and watery. This medication may cause serious skin reactions. They can happen weeks to months after starting the medication. Contact your care team right away if you notice fevers or flu-like symptoms with a rash. The rash may be red or purple and then turn into blisters or peeling of the skin. Or, you might notice a red rash with swelling of the face, lips or lymph nodes in your neck or under your arms. This medication can make you more sensitive to the sun. Keep out of the sun. If you cannot avoid being in the sun, wear protective clothing  and sunscreen. Do not use sun lamps or tanning beds/booths. Be careful brushing or flossing your teeth or using a toothpick because you may get an infection or bleed more easily. If you have any dental work done, tell your dentist you are receiving this medication. What side effects may I notice from receiving this medication? Side effects that you should report to your care team as soon as possible: Allergic reactions--skin rash, itching, hives, swelling of the face, lips, tongue, or throat Aplastic anemia--unusual weakness or fatigue, dizziness,  headache, trouble breathing, increased bleeding or bruising, fever, chills, cough, or sore throat Dry cough, shortness of breath or trouble breathing High potassium level--muscle weakness, fast or irregular heartbeat Liver injury-- right upper belly pain, loss of appetite, nausea, light-colored stool, dark yellow or brown urine, yellowing skin or eyes, unusual weakness or fatigue Low blood sugar (hypoglycemia)--tremors or shaking, anxiety, sweating, cold or clammy skin, confusion, dizziness, rapid heartbeat Low sodium level--muscle weakness, fatigue, dizziness, headache, confusion Low thyroid levels (hypothyroidism)--unusual weakness or fatigue, increased sensitivity to cold, constipation, hair loss, dry skin, weight gain, feelings of depression Rash, fever, and swollen lymph nodes Redness, blistering, peeling, or loosening of the skin, including inside the mouth Severe diarrhea, fever Small, pus-filled bumps on skin Unusual vaginal discharge, itching, or odor Side effects that usually do not require medical attention (report to your care team if they continue or are bothersome): Loss of appetite Nausea Vomiting This list may not describe all possible side effects. Call your doctor for medical advice about side effects. You may report side effects to FDA at 1-800-FDA-1088. Where should I keep my medication? Keep out of the reach of children. Store between 15 and 25 degrees C (59 to 77 degrees F). Protect from light. Keep the container tightly closed. Throw away any unused medication after the expiration date. NOTE: This sheet is a summary. It may not cover all possible information. If you have questions about this medicine, talk to your doctor, pharmacist, or health care provider.  2023 Elsevier/Gold Standard (2020-10-27 00:00:00)

## 2022-07-13 NOTE — Progress Notes (Signed)
Subjective:    Teresa [name] is a 21 y.o. female who complains of burning with urination, frequency, hematuria, hesitancy, incontinence, suprapubic pressure, and urgency for 3 days.  Patient also complains of vaginal discharge. Patient denies back pain, congestion, cough, fever, headache, rhinitis, sorethroat, and stomach ache.  Patient does not have a history of recurrent UTI.  Patient does not have a history of pyelonephritis. Patient reports that she has taken otc Azo for urinary relief.  The following portions of the patient's history were reviewed and updated as appropriate: allergies, current medications, past family history, past medical history, past social history, past surgical history, and problem list. Review of Systems Pertinent items are noted in HPI.    Objective:    BP 114/70   Pulse (!) 106   Resp 16   Wt 196 lb 8 oz (89.1 kg)   LMP  (Within Months)   Breastfeeding No   BMI 31.72 kg/m  General: fatigued            Laboratory:  Urine dipstick shows trace for leukocyte esterase.   Micro exam: not done.    Assessment:    UTI    Plan: Plan:    1. Medications: TMP/SMX 2. Maintain adequate hydration 3. Follow up if symptoms not improving, and prn.   Nunzio Cory.[facility]

## 2022-07-15 LAB — URINE CULTURE

## 2022-09-03 ENCOUNTER — Ambulatory Visit: Payer: Medicaid Other | Admitting: Obstetrics

## 2022-09-03 ENCOUNTER — Other Ambulatory Visit (HOSPITAL_COMMUNITY)
Admission: RE | Admit: 2022-09-03 | Discharge: 2022-09-03 | Disposition: A | Discharge status: Home / Self Care | Payer: Medicaid Other | Source: Ambulatory Visit | Attending: Obstetrics | Admitting: Obstetrics

## 2022-09-03 ENCOUNTER — Encounter: Payer: Self-pay | Admitting: Obstetrics

## 2022-09-03 VITALS — BP 80/65 | HR 91 | Ht 66.0 in | Wt 178.0 lb

## 2022-09-03 DIAGNOSIS — N898 Other specified noninflammatory disorders of vagina: Secondary | ICD-10-CM | POA: Insufficient documentation

## 2022-09-03 DIAGNOSIS — Z3202 Encounter for pregnancy test, result negative: Secondary | ICD-10-CM

## 2022-09-03 LAB — POCT URINE PREGNANCY: Preg Test, Ur: NEGATIVE

## 2022-09-03 NOTE — Progress Notes (Signed)
Teresa [name] is a 22 y.o. G1P1001 who LMP was No LMP recorded. (Menstrual status: IUD)., presents today for a problem visit.   Patient complains of an abnormal vaginal discharge for 5 days. Discharge described as: white and odorless. Vaginal symptoms include local irritation.Vulvar symptoms include local irritation.STI Risk: Possible STD exposure.   Other associated symptoms: burning.Menstrual pattern: She had been bleeding infrequently. Contraception: IUD.  She denies abdominal pain recent antibiotic exposure, denies abdominal pain changes in soaps, detergents coinciding with the onset of her symptoms.  She has not previously self treated or been under treatment by another provider for these symptoms.    BP (!) 80/65   Pulse 91   Ht '5\' 6"'$  (1.676 m)   Wt 178 lb (80.7 kg)   Breastfeeding Yes   BMI 28.73 kg/m  Review of Systems  Constitutional: Negative.   HENT: Negative.    Eyes: Negative.   Respiratory: Negative.    Cardiovascular: Negative.   Gastrointestinal: Negative.   Genitourinary: Negative.   Musculoskeletal: Negative.   Skin:        Some perineal irritation noted after intercourse  Neurological: Negative.   Endo/Heme/Allergies: Negative.   Psychiatric/Behavioral: Negative.     Physical Exam Constitutional:      Appearance: Normal appearance. She is normal weight.  Cardiovascular:     Rate and Rhythm: Normal rate and regular rhythm.  Pulmonary:     Effort: Pulmonary effort is normal.     Breath sounds: Normal breath sounds.  Abdominal:     Palpations: Abdomen is soft.  Genitourinary:    General: Normal vulva.     Vagina: Vaginal discharge present.     Rectum: Normal.     Comments: No external lesions ore dicharge noted. There is a very small spot at the introitus that is red and tender, likely a smal  postcoital laceration. Moderate amount of white , nonmalodorous vaginal discharge seen on spec exam. IUD strings are visible protruding 1-2 cms from the os.uterus is  nonenlarged, mobile and anteverted. Musculoskeletal:     Cervical back: Normal range of motion and neck supple.  Skin:    General: Skin is warm and dry.  Neurological:     General: No focal deficit present.     Mental Status: She is alert and oriented to person, place, and time.  Psychiatric:        Mood and Affect: Mood normal.        Behavior: Behavior normal.    A: Vaginal irritation and discharge P: Aptima swab sent.We will treat based on the test results. Suggested she try Vagisil for local irritation.  RTC PRN.  Imagene Riches, CNM  09/03/2022 12:21 PM

## 2022-09-06 ENCOUNTER — Encounter: Payer: Self-pay | Admitting: Obstetrics

## 2022-09-06 ENCOUNTER — Other Ambulatory Visit: Payer: Self-pay | Admitting: Obstetrics

## 2022-09-06 LAB — CERVICOVAGINAL ANCILLARY ONLY
Bacterial Vaginitis (gardnerella): NEGATIVE
Candida Glabrata: NEGATIVE
Candida Vaginitis: POSITIVE — AB
Comment: NEGATIVE
Comment: NEGATIVE
Comment: NEGATIVE
Comment: NEGATIVE
Trichomonas: NEGATIVE

## 2022-09-06 MED ORDER — FLUCONAZOLE 150 MG PO TABS: 150.0000 mg | ORAL_TABLET | Freq: Once | ORAL | 1 refills | Status: AC

## 2022-09-06 NOTE — Progress Notes (Signed)
Aptima swab sent to [place] shows yeast vaginitis. A Rx for Diflucan has been sent to her pharmacy,and she is notified via [place].  Imagene Riches, CNM  09/06/2022 10:51 PM

## 2022-09-07 ENCOUNTER — Telehealth: Payer: Self-pay

## 2022-09-07 NOTE — Telephone Encounter (Signed)
Pt calling; has seen results of yeast inf; can rx be called in to her pharm on [address]?  [phone]  Left detailed msg "rx has been sent in yesterday"

## 2022-09-29 ENCOUNTER — Ambulatory Visit: Payer: Medicaid Other | Admitting: Obstetrics

## 2022-11-03 ENCOUNTER — Telehealth: Payer: Self-pay

## 2022-11-03 NOTE — Telephone Encounter (Signed)
Left voicemail for Teresa [name] to return my call.

## 2022-11-03 NOTE — Telephone Encounter (Signed)
Teresa [name] called triage line and left voicemail stating she believes she has an yeast infection.

## 2022-11-04 ENCOUNTER — Other Ambulatory Visit (HOSPITAL_COMMUNITY)
Admission: RE | Admit: 2022-11-04 | Discharge: 2022-11-04 | Disposition: A | Discharge status: Home / Self Care | Payer: Medicaid Other | Source: Ambulatory Visit | Attending: Obstetrics | Admitting: Obstetrics

## 2022-11-04 ENCOUNTER — Ambulatory Visit (INDEPENDENT_AMBULATORY_CARE_PROVIDER_SITE_OTHER): Payer: Medicaid Other

## 2022-11-04 VITALS — BP 113/76 | HR 108 | Wt 160.3 lb

## 2022-11-04 DIAGNOSIS — N898 Other specified noninflammatory disorders of vagina: Secondary | ICD-10-CM | POA: Diagnosis not present

## 2022-11-04 DIAGNOSIS — R300 Dysuria: Secondary | ICD-10-CM

## 2022-11-04 DIAGNOSIS — N760 Acute vaginitis: Secondary | ICD-10-CM | POA: Insufficient documentation

## 2022-11-04 LAB — POCT URINALYSIS DIPSTICK
Bilirubin, UA: NEGATIVE
Glucose, UA: NEGATIVE
Ketones, UA: NEGATIVE
Leukocytes, UA: NEGATIVE — AB
Nitrite, UA: NEGATIVE
Protein, UA: POSITIVE — AB
Spec Grav, UA: 1.025 (ref 1.010–1.025)
Urobilinogen, UA: 0.2 E.U./dL
pH, UA: 5 (ref 5.0–8.0)

## 2022-11-04 NOTE — Progress Notes (Signed)
    NURSE VISIT NOTE  Subjective:    Patient ID: Teresa [name], female    DOB: Nov 05, 2000, 22 y.o.   MRN: [id number]  HPI  Patient is a 23 y.o. G50P1001 female who presents for white, milky, vaginal erythema noted, and vulvar erythema noted vaginal discharge for 3 day(s). Denies abnormal vaginal bleeding or significant pelvic pain or fever. admits to dysuria. Patient has history of known exposure to STD. Patient reports that she has tried taking otc Azo and had some relief.   Objective:    BP 113/76   Pulse (!) 108   Wt 160 lb 4.8 oz (72.7 kg)   BMI 25.87 kg/m      Assessment:   1. Vaginal discharge   2. Acute vaginitis   3. Dysuria     rule out GC or chlamydia and nonspecific vaginitis  Plan:   GC and chlamydia DNA  probe sent to lab. Treatment: abstain from coitus during course of treatment ROV prn if symptoms persist or worsen. Patient provided urine for POCT, there was not enough to culture and patient was unable to void more. Patient advised that if urinary symptoms persist to return back to clinic for evaluation.    Minette Headland, CMA

## 2022-11-04 NOTE — Patient Instructions (Signed)
Vaginitis  Vaginitis is irritation and swelling of the vagina. Treatment will depend on the cause. What are the causes? It can be caused by: Bacteria. Yeast. A parasite. A virus. Low hormone levels. Bubble baths, scented tampons, and feminine sprays. Other things can change the balance of the yeast and bacteria that live in the vagina. These include: Antibiotic medicines. Not being clean enough. Some birth control methods. Sex. Infection. Diabetes. A weakened body defense system (immune system). What increases the risk? Smoking or being around someone who smokes. Using washes (douches), scented tampons, or scented pads. Wearing tight pants or thong underwear. Using birth control pills or an IUD. Having sex without a condom or having a lot of partners. Having an STI. Using a certain product to kill sperm (nonoxynol-9). Eating foods that are high in sugar. Having diabetes. Having low levels of a female hormone. Having a weakened body defense system. Being pregnant or breastfeeding. What are the signs or symptoms? Fluid coming from the vagina that is not normal. A bad smell. Itching, pain, or swelling. Pain with sex. Pain or burning when you pee (urinate). Sometimes there are no symptoms. How is this treated? Treatment may include: Antibiotic creams or pills. Antifungal medicines. Medicines to ease symptoms if you have a virus. Your sex partner should also be treated. Estrogen medicines. Avoiding scented soaps, sprays, or douches. Stopping use of products that caused irritation and then using a cream to treat symptoms. Follow these instructions at home: Lifestyle Keep the area around your vagina clean and dry. Avoid using soap. Rinse the area with water. Until your doctor says it is okay: Do not use washes for the vagina. Do not use tampons. Do not have sex. Wipe from front to back after going to the bathroom. When your doctor says it is okay, practice safe sex  and use condoms. General instructions Take over-the-counter and prescription medicines only as told by your doctor. If you were prescribed an antibiotic medicine, take or use it as told by your doctor. Do not stop taking or using it even if you start to feel better. Keep all follow-up visits. How is this prevented? Do not use things that can irritate the vagina, such as fabric softeners. Avoid these products if they are scented: Sprays. Detergents. Tampons. Products for cleaning the vagina. Soaps or bubble baths. Let air reach your vagina. To do this: Wear cotton underwear. Do not wear: Underwear while you sleep. Tight pants. Thong underwear. Underwear or nylons without a cotton panel. Take off any wet clothing, such as bathing suits, as soon as you can. Practice safe sex and use condoms. Contact a doctor if: You have pain in your belly or in the area between your hips. You have a fever or chills. Your symptoms last for more than 2-3 days. Get help right away if: You have a fever and your symptoms get worse all of a sudden. Summary Vaginitis is irritation and swelling of the vagina. Treatment will depend on the cause of the condition. Do not use washes or tampons or have sex until your doctor says it is okay. This information is not intended to replace advice given to you by your health care provider. Make sure you discuss any questions you have with your health care provider. Document Revised: 01/24/2020 Document Reviewed: 01/24/2020 Elsevier Patient Education  2023 [employer] Inc.  

## 2022-11-05 LAB — CERVICOVAGINAL ANCILLARY ONLY
Bacterial Vaginitis (gardnerella): NEGATIVE
Candida Glabrata: NEGATIVE
Candida Vaginitis: POSITIVE — AB
Chlamydia: NEGATIVE
Comment: NEGATIVE
Comment: NEGATIVE
Comment: NEGATIVE
Comment: NEGATIVE
Comment: NEGATIVE
Comment: NORMAL
Neisseria Gonorrhea: NEGATIVE
Trichomonas: NEGATIVE

## 2022-11-08 ENCOUNTER — Other Ambulatory Visit: Payer: Self-pay

## 2022-11-08 DIAGNOSIS — B379 Candidiasis, unspecified: Secondary | ICD-10-CM

## 2022-11-08 MED ORDER — FLUCONAZOLE 150 MG PO TABS: 150.0000 mg | ORAL_TABLET | Freq: Every day | ORAL | 0 refills | Status: DC

## 2022-11-09 NOTE — Patient Instructions (Signed)

## 2022-11-09 NOTE — Progress Notes (Unsigned)
GYNECOLOGY ANNUAL PHYSICAL EXAM PROGRESS NOTE  Subjective:    MCKINLY COPPENS is a 22 y.o. G24P1001 female who presents for an annual exam\ and Iud Removal. The patient has no complaints today. The patient {is/is not/has never been:13135} sexually active. The patient participates in regular exercise: {yes/no/not asked:9010}. Has the patient ever been transfused or tattooed?: {yes/no/not asked:9010}. The patient reports that there {is/is not:9024} domestic violence in her life.    Menstrual History: Menarche age: *** No LMP recorded. (Menstrual status: IUD).     Gynecologic History:  Contraception: {method:5051} History of STI's:  Last Pap: 02/01/22. Results were: normal.  Pt Denies h/o abnormal pap smears. Last mammogram: n/a. Results were:        OB History  Gravida Para Term Preterm AB Living  1 1 1  0 0 1  SAB IAB Ectopic Multiple Live Births  0 0 0 0 1    # Outcome Date GA Lbr Len/2nd Weight Sex Delivery Anes PTL Lv  1 Term 12/29/20 [redacted]w[redacted]d / 04:03 8 lb 8.2 oz (3.86 kg) F Vag-Vacuum EPI  LIV     Name: KYRIA, SPADAFORA     Apgar1: 8  Apgar5: 9    Past Medical History:  Diagnosis Date   Elevated TSH    History of chicken pox    Mass of left ovary    Moderate depressive episode    Postpartum care following vaginal delivery 12/31/2020   Postpartum depression 02/12/2020   Supervision of low-risk first pregnancy 05/14/2020    Nursing Staff Provider  Office Location  [facility] Dating   7wk [place]  Language  English Anatomy US  complete  Flu Vaccine   declined Genetic Screen  NIPS: normal xx  TDaP vaccine   12/09/20 Hgb A1C or  GTT Early : n/a Third trimester : 113  Covid  J&J   LAB RESULTS   Rhogam  Not needed Blood Type O/Positive/-- (10/06 1430)   Feeding Plan  Breast Antibody Negative (10/06 1430)  Contraception None/condo   Third degree perineal laceration during delivery with less than 50% tear of external anal sphincter 12/29/2020    Past Surgical History:  Procedure  Laterality Date   DENTAL SURGERY  2019   Dental implant    Family History  Problem Relation Age of Onset   Obesity Mother     Social History   Socioeconomic History   Marital status: Single    Spouse name: Not on file   Number of children: Not on file   Years of education: Not on file   Highest education level: Not on file  Occupational History   Not on file  Tobacco Use   Smoking status: Former    Types: Cigarettes    Quit date: 2021    Years since quitting: 3.2   Smokeless tobacco: Never  Vaping Use   Vaping Use: Former  Substance and Sexual Activity   Alcohol use: Yes   Drug use: Yes    Frequency: 7.0 times per week    Types: Marijuana    Comment: smoking    Sexual activity: Yes    Partners: Male    Birth control/protection: I.U.D.    Comment: Mirena  Other Topics Concern   Not on file  Social History Narrative   Not on file   Social Determinants of Health   Financial Resource Strain: Not on file  Food Insecurity: Not on file  Transportation Needs: Not on file  Physical Activity: Insufficiently Active (02/27/2018)  Exercise Vital Sign    Days of Exercise per Week: 3 days    Minutes of Exercise per Session: 30 min  Stress: No Stress Concern Present (02/27/2018)   [place]    Feeling of Stress : Not at all  Social Connections: Not on file  Intimate Partner Violence: Not on file    Current Outpatient Medications on File Prior to Visit  Medication Sig Dispense Refill   amphetamine-dextroamphetamine (ADDERALL) 20 MG tablet Take by mouth.     buPROPion (WELLBUTRIN) 75 MG tablet Take 1 tab by mouth each morning for a week then take 2 tabs each morning thereafter     clotrimazole-betamethasone (LOTRISONE) cream Apply topically 2 (two) times daily.     escitalopram (LEXAPRO) 10 MG tablet Take half tab daily for 1 week then take 1 tab daily (Patient not taking: Reported on 11/04/2022)      fluconazole (DIFLUCAN) 150 MG tablet Take 1 tablet (150 mg total) by mouth daily. 1 tablet 0   levonorgestrel (MIRENA) 20 MCG/DAY IUD 1 each by Intrauterine route once.     No current facility-administered medications on file prior to visit.    No Known Allergies   Review of Systems Constitutional: negative for chills, fatigue, fevers and sweats Eyes: negative for irritation, redness and visual disturbance Ears, nose, mouth, throat, and face: negative for hearing loss, nasal congestion, snoring and tinnitus Respiratory: negative for asthma, cough, sputum Cardiovascular: negative for chest pain, dyspnea, exertional chest pressure/discomfort, irregular heart beat, palpitations and syncope Gastrointestinal: negative for abdominal pain, change in bowel habits, nausea and vomiting Genitourinary: negative for abnormal menstrual periods, genital lesions, sexual problems and vaginal discharge, dysuria and urinary incontinence Integument/breast: negative for breast lump, breast tenderness and nipple discharge Hematologic/lymphatic: negative for bleeding and easy bruising Musculoskeletal:negative for back pain and muscle weakness Neurological: negative for dizziness, headaches, vertigo and weakness Endocrine: negative for diabetic symptoms including polydipsia, polyuria and skin dryness Allergic/Immunologic: negative for hay fever and urticaria      Objective:  currently breastfeeding. There is no height or weight on file to calculate BMI.    General Appearance:    Alert, cooperative, no distress, appears stated age  Head:    Normocephalic, without obvious abnormality, atraumatic  Eyes:    PERRL, conjunctiva/corneas clear, EOM's intact, both eyes  Ears:    Normal external ear canals, both ears  Nose:   Nares normal, septum midline, mucosa normal, no drainage or sinus tenderness  Throat:   Lips, mucosa, and tongue normal; teeth and gums normal  Neck:   Supple, symmetrical, trachea midline, no  adenopathy; thyroid: no enlargement/tenderness/nodules; no carotid bruit or JVD  Back:     Symmetric, no curvature, ROM normal, no CVA tenderness  Lungs:     Clear to auscultation bilaterally, respirations unlabored  Chest Wall:    No tenderness or deformity   Heart:    Regular rate and rhythm, S1 and S2 normal, no murmur, rub or gallop  Breast Exam:    No tenderness, masses, or nipple abnormality  Abdomen:     Soft, non-tender, bowel sounds active all four quadrants, no masses, no organomegaly.    Genitalia:    Pelvic:external genitalia normal, vagina without lesions, discharge, or tenderness, rectovaginal septum  normal. Cervix normal in appearance, no cervical motion tenderness, no adnexal masses or tenderness.  Uterus normal size, shape, mobile, regular contours, nontender.  Rectal:    Normal external sphincter.  No hemorrhoids appreciated.  Internal exam not done.   Extremities:   Extremities normal, atraumatic, no cyanosis or edema  Pulses:   2+ and symmetric all extremities  Skin:   Skin color, texture, turgor normal, no rashes or lesions  Lymph nodes:   Cervical, supraclavicular, and axillary nodes normal  Neurologic:   CNII-XII intact, normal strength, sensation and reflexes throughout   .  Labs:  Lab Results  Component Value Date   WBC 12.0 (H) 12/31/2020   HGB 9.6 (L) 12/31/2020   HCT 28.2 (L) 12/31/2020   MCV 90.7 12/31/2020   PLT 154 12/31/2020    Lab Results  Component Value Date   CREATININE 0.74 12/31/2020   BUN 11 12/31/2020   NA 133 (L) 12/31/2020   K 3.3 (L) 12/31/2020   CL 103 12/31/2020   CO2 19 (L) 12/31/2020    Lab Results  Component Value Date   ALT 15 12/31/2020   AST 24 12/31/2020   ALKPHOS 118 12/31/2020   BILITOT 0.6 12/31/2020    Lab Results  Component Value Date   TSH 2.670 05/14/2020     Assessment:   No diagnosis found.   Plan:  Blood tests: {blood tests:13147}. Breast self exam technique reviewed and patient encouraged to  perform self-exam monthly. Contraception: {contraceptive methods:5051}. Discussed healthy lifestyle modifications. Mammogram discussed Pap smear {discussed/ordered:14545}. COVID vaccination status: Follow up in 1 year for annual exam   Gigi Gin CNM. [place]

## 2022-11-10 ENCOUNTER — Encounter: Payer: Self-pay | Admitting: Obstetrics

## 2022-11-10 ENCOUNTER — Other Ambulatory Visit (HOSPITAL_COMMUNITY)
Admission: RE | Admit: 2022-11-10 | Discharge: 2022-11-10 | Disposition: A | Discharge status: Home / Self Care | Payer: Medicaid Other | Source: Ambulatory Visit | Attending: Obstetrics | Admitting: Obstetrics

## 2022-11-10 ENCOUNTER — Ambulatory Visit (INDEPENDENT_AMBULATORY_CARE_PROVIDER_SITE_OTHER): Payer: Medicaid Other | Admitting: Obstetrics

## 2022-11-10 VITALS — BP 108/74 | HR 113 | Ht 66.0 in | Wt 160.4 lb

## 2022-11-10 DIAGNOSIS — Z124 Encounter for screening for malignant neoplasm of cervix: Secondary | ICD-10-CM | POA: Diagnosis present

## 2022-11-10 DIAGNOSIS — Z01419 Encounter for gynecological examination (general) (routine) without abnormal findings: Secondary | ICD-10-CM | POA: Diagnosis present

## 2022-11-10 DIAGNOSIS — Z30432 Encounter for removal of intrauterine contraceptive device: Secondary | ICD-10-CM | POA: Diagnosis not present

## 2022-11-10 DIAGNOSIS — R102 Pelvic and perineal pain: Secondary | ICD-10-CM

## 2022-11-10 DIAGNOSIS — Z30011 Encounter for initial prescription of contraceptive pills: Secondary | ICD-10-CM

## 2022-11-10 DIAGNOSIS — Z113 Encounter for screening for infections with a predominantly sexual mode of transmission: Secondary | ICD-10-CM

## 2022-11-10 DIAGNOSIS — Z0000 Encounter for general adult medical examination without abnormal findings: Secondary | ICD-10-CM

## 2022-11-10 MED ORDER — NORETHIN ACE-ETH ESTRAD-FE 1-20 MG-MCG PO TABS
1.0000 | ORAL_TABLET | Freq: Every day | ORAL | 4 refills | Status: DC
Start: 2022-11-10 — End: 2023-03-04

## 2022-11-12 LAB — CBC WITH DIFFERENTIAL
Basophils Absolute: 0 10*3/uL (ref 0.0–0.2)
Basos: 0 %
EOS (ABSOLUTE): 0.1 10*3/uL (ref 0.0–0.4)
Eos: 1 %
Hematocrit: 46.1 % (ref 34.0–46.6)
Hemoglobin: 15.4 g/dL (ref 11.1–15.9)
Immature Grans (Abs): 0 10*3/uL (ref 0.0–0.1)
Immature Granulocytes: 0 %
Lymphocytes Absolute: 1.3 10*3/uL (ref 0.7–3.1)
Lymphs: 21 %
MCH: 31 pg (ref 26.6–33.0)
MCHC: 33.4 g/dL (ref 31.5–35.7)
MCV: 93 fL (ref 79–97)
Monocytes Absolute: 0.3 10*3/uL (ref 0.1–0.9)
Monocytes: 5 %
Neutrophils Absolute: 4.5 10*3/uL (ref 1.4–7.0)
Neutrophils: 73 %
RBC: 4.97 x10E6/uL (ref 3.77–5.28)
RDW: 13.3 % (ref 11.7–15.4)
WBC: 6.1 10*3/uL (ref 3.4–10.8)

## 2022-11-12 LAB — CERVICOVAGINAL ANCILLARY ONLY
Chlamydia: NEGATIVE
Comment: NEGATIVE
Comment: NORMAL
Neisseria Gonorrhea: NEGATIVE

## 2022-11-12 LAB — RPR, QUANT. (REFLEX): Rapid Plasma Reagin, Quant: 1:1 {titer} — ABNORMAL HIGH

## 2022-11-12 LAB — HEP, RPR, HIV PANEL
HIV Screen 4th Generation wRfx: NONREACTIVE
Hepatitis B Surface Ag: NEGATIVE
RPR Ser Ql: REACTIVE — AB

## 2022-11-15 LAB — CYTOLOGY - PAP
Adequacy: ABSENT
Comment: NEGATIVE
Diagnosis: UNDETERMINED — AB
High risk HPV: NEGATIVE

## 2022-11-17 ENCOUNTER — Other Ambulatory Visit: Payer: Self-pay

## 2022-11-17 DIAGNOSIS — A530 Latent syphilis, unspecified as early or late: Secondary | ICD-10-CM

## 2022-11-19 ENCOUNTER — Encounter: Payer: Self-pay | Admitting: Obstetrics

## 2022-11-25 NOTE — Progress Notes (Signed)
Called this patient to discuss her preliminary RPR results. She understands that we are waiting on the TPA results. In the past her TPA was negative  after a + RPR and quant result. She plans to call the [facility] office next week to f/u on results. Mirna Mires, CNM  11/25/2022 6:14 PM

## 2022-11-29 ENCOUNTER — Telehealth: Payer: Self-pay

## 2022-11-29 LAB — SPECIMEN STATUS REPORT

## 2022-11-29 LAB — T.PALLIDUM AB, TOTAL: Treponema pallidum Antibodies: NONREACTIVE

## 2022-11-29 NOTE — Telephone Encounter (Addendum)
Patient inquiring about results of T pallidum. Advised Non- Reactive.

## 2022-12-22 ENCOUNTER — Encounter: Payer: Self-pay | Admitting: Advanced Practice Midwife

## 2022-12-22 ENCOUNTER — Ambulatory Visit: Payer: Medicaid Other | Admitting: Advanced Practice Midwife

## 2022-12-22 VITALS — BP 104/70 | HR 79 | Ht 66.0 in | Wt 155.0 lb

## 2022-12-22 DIAGNOSIS — A630 Anogenital (venereal) warts: Secondary | ICD-10-CM

## 2022-12-22 DIAGNOSIS — N898 Other specified noninflammatory disorders of vagina: Secondary | ICD-10-CM

## 2022-12-24 ENCOUNTER — Encounter: Payer: Self-pay | Admitting: Advanced Practice Midwife

## 2022-12-24 NOTE — Patient Instructions (Signed)
Genital Warts Genital warts are a common sexually transmitted infection (STI). They can be easily passed from person to person (transmitted). The warts may appear as small bumps on the skin near the genitals and the opening of the butt (anus). They are caused by an infection with a type of the virus human papillomavirus (HPV). What are the causes? This condition is caused by HPV. HPV is spread by having sex without protection with someone who has the infection. It can be spread through vaginal, anal, or oral sex. What increases the risk? You are more likely to get genital warts if: You have direct contact with a person with HPV. You have sex without using protection. This includes oral, vaginal, and anal sex. You have more than one sexual partner. You are female and not circumcised. You have a female sexual partner who is not circumcised. You have a weak body defense system (immune system). This may be because of a disease or medicine. What are the signs or symptoms? Many people do not know that they are infected with HPV or have genital warts. Symptoms of genital warts may include: Small growths around the groin, genitals, or anus. The growths may vary in size and shape. They may be flat, dome-shaped, or shaped like cauliflower. They are often the same color as your skin. Tell your health care provider if you have: Irritated skin near the genitals or anus. This includes on the groin. You may have itching, pain, or bleeding. Genital warts that look odd, bleed, turn into open sores (ulcers), or change in color. Pain during sex. How is this diagnosed? Genital warts may be diagnosed based on your symptoms and a physical exam. You may also have other tests, such as: A biopsy. This is when a piece of tissue is removed for testing and looked at under a microscope. In females: An HPV test is similar to a Pap test. It involves taking cells from the cervix. It may be done at the same time as a Pap  test. Colposcopy. This is a test done to look at the vagina and the lowest part of the uterus (cervix). How is this treated? There is no treatment for HPV. But there are treatments for the warts caused by HPV. You may need: Medicines. These include solutions or creams that you apply to your skin (topical). Procedures to remove the warts. These include: Cryotherapy. This is when the warts are frozen off with liquid nitrogen. Burning the warts with a laser (laser treatment) or electric probe (electrocautery). Surgery. If left untreated, the warts may go away on their own, stay the same, or increase in size and number. Follow these instructions at home: Medicines  Apply over-the-counter and prescription medicines only as told by your provider. Do not treat genital warts with medicines that are used for treating hand or foot warts. Talk with your provider about using over-the-counter creams to treat itching. General instructions Do not touch or scratch the warts. Tell your current and past sexual partners about the warts. They may also need treatment. If you are female, get regular Pap and HPV tests as told by your provider. If you become pregnant, tell your provider that you have had genital warts. They will need to watch you closely during pregnancy. Warts may spread or grow faster when you are pregnant. In rare cases, warts may spread to a baby during birth. Keep all follow-up visits. Your provider will watch you closely. Some types of HPV increase the risk for cervical  cancer. How is this prevented? To prevent genital warts: Use a new latex or polyurethane condom for each sex act. Limit how many sexual partners you have. Have a sex partner who does not have other sex partners. Talk with your sexual partners about their health. Get the HPV vaccine. It is recommended for people who are 21-34 years old. It can prevent infection with the types of HPV that are linked to genital warts and  cancer. Where to find more information Centers for Disease Control and Prevention (CDC): TonerPromos.no Contact a health care provider if: You have redness, swelling, or pain where your skin is being treated for the warts. You have irritation or itching in your groin, genitals, or anus. You feel lumps in or around your genitals or anus. You have bleeding near your genitals or anus. You have pain during sex, or you bleed after sex. This information is not intended to replace advice given to you by your health care provider. Make sure you discuss any questions you have with your health care provider. Document Revised: 03/17/2022 Document Reviewed: 03/17/2022 Elsevier Patient Education  2023 [employer].

## 2022-12-24 NOTE — Progress Notes (Signed)
Patient ID: Teresa [name], female   DOB: Aug 20, 2000, 22 y.o.   MRN: [id number]  Reason for Visit: small white bump in vaginal area   Subjective:  Date of Service: 12/22/2022  HPI:   Teresa [name] is a 22 y.o. female being seen with several concerns. She noticed a white bump at her introitus recently and is seeking reassurance regarding normal. She also mentions red bumps in mons area and has stopped shaving several months ago due to the same. She reports heavy and painful period since having her IUD out approximately 6 weeks ago. She was prescribed hydrocodone by provider on 5/9 due to the pain. She has been taking OCP and will give it more time to see if periods adjust. She last mentions bump near her anal area and wonders if it is a wart.  Past Medical History:  Diagnosis Date   Elevated TSH    History of chicken pox    Mass of left ovary    Moderate depressive episode    Postpartum care following vaginal delivery 12/31/2020   Postpartum depression 02/12/2020   Supervision of low-risk first pregnancy 05/14/2020    Nursing Staff Provider  Office Location  [facility] Dating   7wk [place]  Language  English Anatomy US  complete  Flu Vaccine   declined Genetic Screen  NIPS: normal xx  TDaP vaccine   12/09/20 Hgb A1C or  GTT Early : n/a Third trimester : 113  Covid  J&J   LAB RESULTS   Rhogam  Not needed Blood Type O/Positive/-- (10/06 1430)   Feeding Plan  Breast Antibody Negative (10/06 1430)  Contraception None/condo   Third degree perineal laceration during delivery with less than 50% tear of external anal sphincter 12/29/2020   Family History  Problem Relation Age of Onset   Obesity Mother    Past Surgical History:  Procedure Laterality Date   DENTAL SURGERY  2019   Dental implant    Short Social History:  Social History   Tobacco Use   Smoking status: Former    Types: Cigarettes    Quit date: 2021    Years since quitting: 3.3   Smokeless tobacco: Never  Substance Use Topics    Alcohol use: Yes    No Known Allergies  Current Outpatient Medications  Medication Sig Dispense Refill   amphetamine-dextroamphetamine (ADDERALL) 20 MG tablet Take by mouth.     buPROPion (WELLBUTRIN) 75 MG tablet Take 1 tab by mouth each morning for a week then take 2 tabs each morning thereafter     norethindrone-ethinyl estradiol-FE (JUNEL FE 1/20) 1-20 MG-MCG tablet Take 1 tablet by mouth daily. 84 tablet 4   clotrimazole-betamethasone (LOTRISONE) cream Apply topically 2 (two) times daily. (Patient not taking: Reported on 12/22/2022)     escitalopram (LEXAPRO) 10 MG tablet Take half tab daily for 1 week then take 1 tab daily (Patient not taking: Reported on 11/04/2022)     fluconazole (DIFLUCAN) 150 MG tablet Take 1 tablet (150 mg total) by mouth daily. (Patient not taking: Reported on 12/22/2022) 1 tablet 0   No current facility-administered medications for this visit.    Review of Systems  Constitutional:  Negative for chills and fever.  HENT:  Negative for congestion, ear discharge, ear pain, hearing loss, sinus pain and sore throat.   Eyes:  Negative for blurred vision and double vision.  Respiratory:  Negative for cough, shortness of breath and wheezing.   Cardiovascular:  Negative for chest pain, palpitations  and leg swelling.  Gastrointestinal:  Negative for abdominal pain, blood in stool, constipation, diarrhea, heartburn, melena, nausea and vomiting.  Genitourinary:  Negative for dysuria, flank pain, frequency, hematuria and urgency.       Positive for bump in vaginal area and bump in anal area, shaving bumps on mons  Musculoskeletal:  Negative for back pain, joint pain and myalgias.  Skin:  Negative for itching and rash.  Neurological:  Negative for dizziness, tingling, tremors, sensory change, speech change, focal weakness, seizures, loss of consciousness, weakness and headaches.  Endo/Heme/Allergies:  Negative for environmental allergies. Does not bruise/bleed easily.        Positive for heavy period  Psychiatric/Behavioral:  Negative for depression, hallucinations, memory loss, substance abuse and suicidal ideas. The patient is not nervous/anxious and does not have insomnia.         Objective:  Objective   Vitals:   12/22/22 1120  BP: 104/70  Pulse: 79  Weight: 155 lb (70.3 kg)  Height: 5\' 6"  (1.676 m)   Body mass index is 25.02 kg/m. Constitutional: Well nourished, well developed female in no acute distress.  HEENT: normal Skin: Warm and dry.  Cardiovascular: Regular rate and rhythm.   Extremity:  no edema   Respiratory: Clear to auscultation bilateral. Normal respiratory effort Psych: Alert and Oriented x3. No memory deficits. Normal mood and affect.    Pelvic exam: is not limited by body habitus EGBUS: small- pinpoint- white bump left peri-clitoral that appears to be sebaceous, red bumps on mons with ingrowing hairs, likely condyloma left peri-anus treated with Trichloroacetic Acid, several other bumps on right inner thigh that do not appear to be condyloma.  Vagina: within normal limits and with normal mucosa    Assessment/Plan:     22 y.o. G1 P57 female with benign peri-clitoral bump, condyloma peri-anal  Follow up as needed for worsening symptoms or for additional treatment of condyloma   [name], CNM [facility] 12/24/2022 2:07 PM

## 2023-01-05 ENCOUNTER — Other Ambulatory Visit: Payer: Self-pay

## 2023-01-05 DIAGNOSIS — R102 Pelvic and perineal pain: Secondary | ICD-10-CM

## 2023-01-12 ENCOUNTER — Ambulatory Visit
Admission: RE | Admit: 2023-01-12 | Discharge: 2023-01-12 | Disposition: A | Discharge status: Home / Self Care | Payer: Medicaid Other | Source: Ambulatory Visit | Attending: Obstetrics | Admitting: Obstetrics

## 2023-01-12 ENCOUNTER — Encounter: Payer: Self-pay | Admitting: Obstetrics

## 2023-01-12 ENCOUNTER — Other Ambulatory Visit: Payer: Self-pay | Admitting: Obstetrics

## 2023-01-12 DIAGNOSIS — R102 Pelvic and perineal pain: Secondary | ICD-10-CM | POA: Diagnosis present

## 2023-01-12 DIAGNOSIS — N939 Abnormal uterine and vaginal bleeding, unspecified: Secondary | ICD-10-CM | POA: Diagnosis present

## 2023-01-14 IMAGING — US US ABDOMEN LIMITED RUQ/ASCITES
1 series · 14 of 25 positions shown · non-contrast
Comparison: 10/16/2020

CLINICAL DATA: Right upper quadrant pain during pregnancy

EXAM:
ULTRASOUND ABDOMEN LIMITED RIGHT UPPER QUADRANT

[Series 1: us abdomen limited ruq (liver/gb) · 14 of 38 slices shown]
[im 1/38]
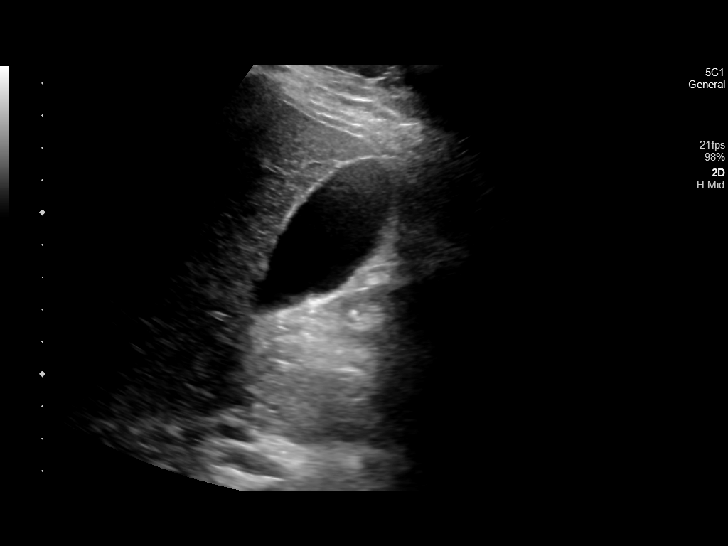
[im 4/38]
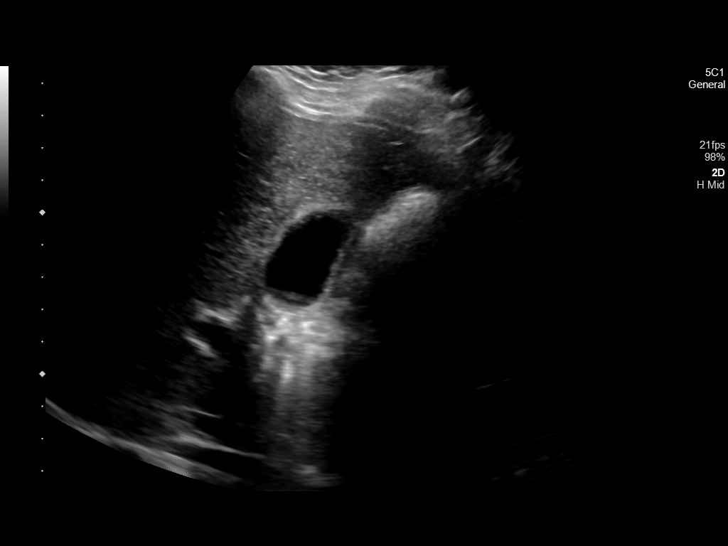
[im 7/38]
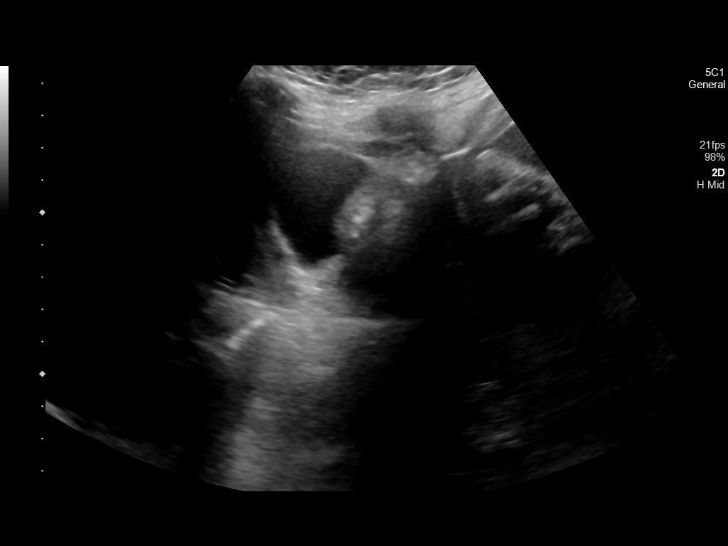
[im 10/38]
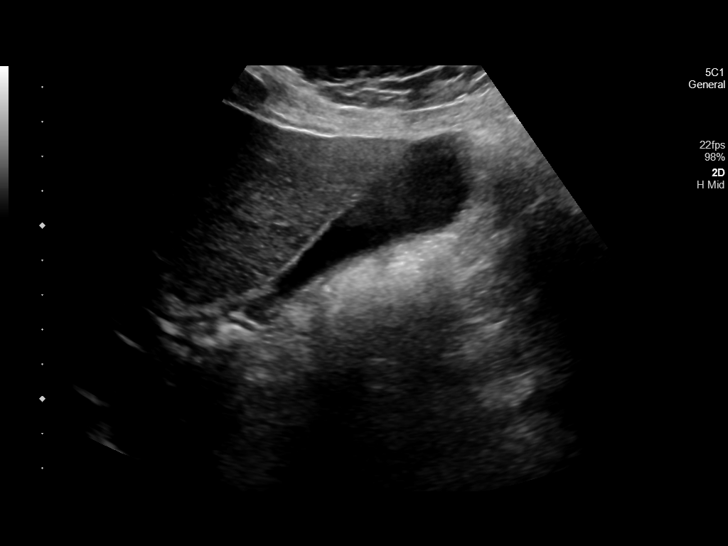
[im 13/38]
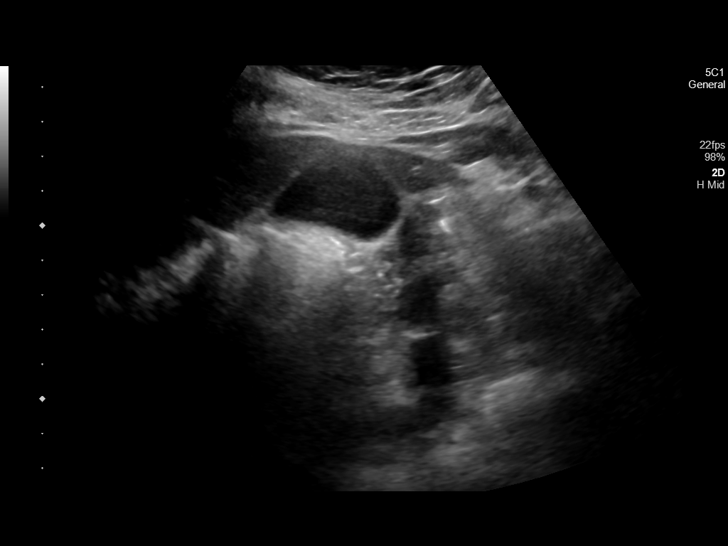
[im 14/38]
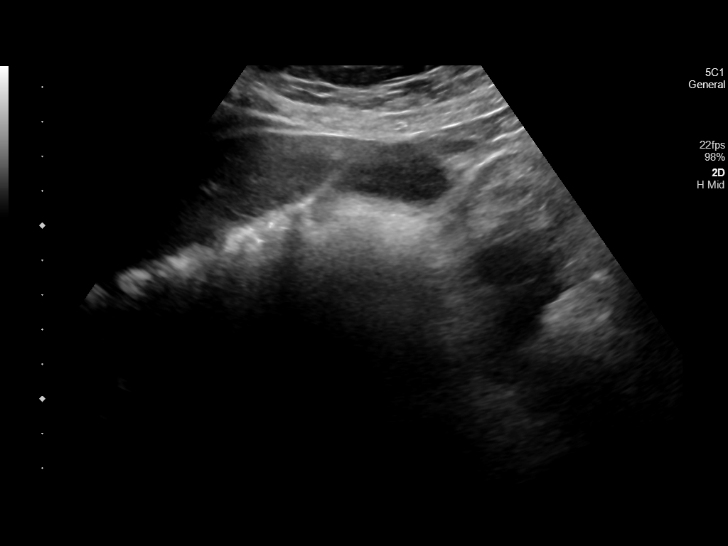
[im 17/38]
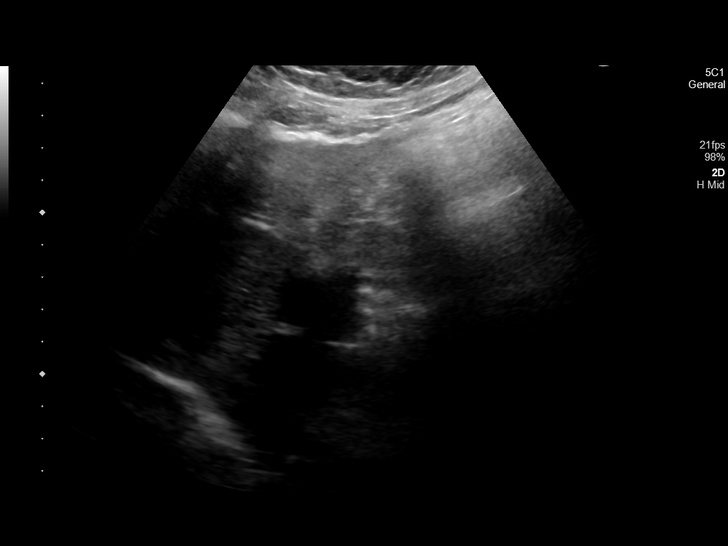
[im 21/38]
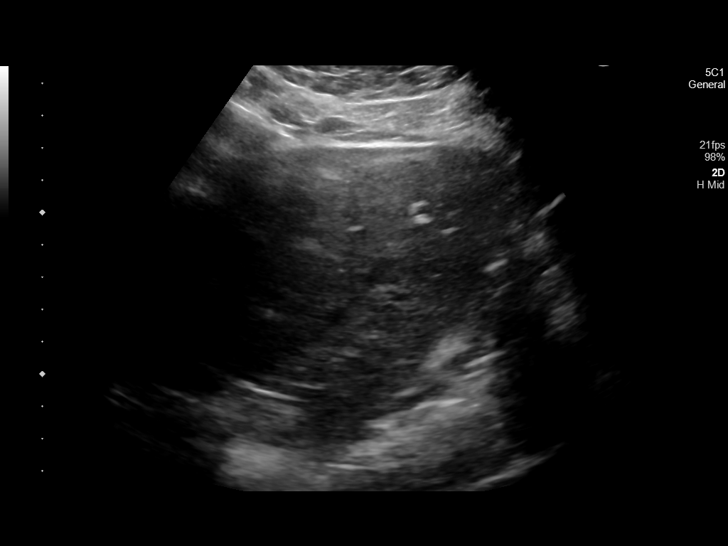
[im 24/38]
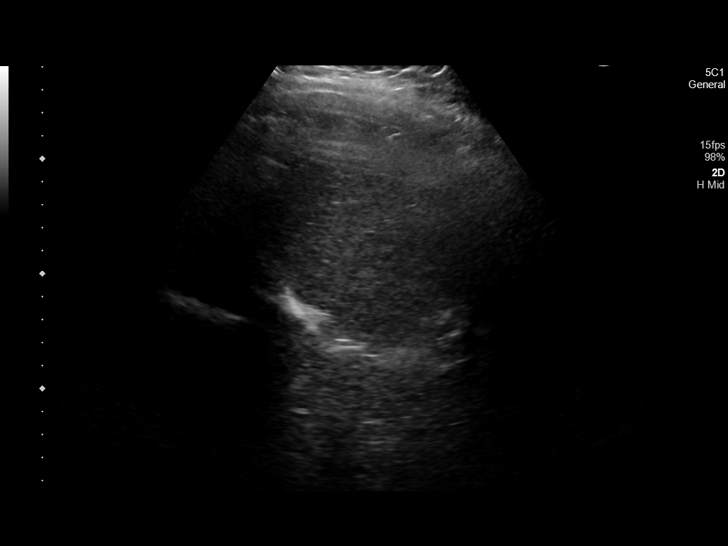
[im 25/38]
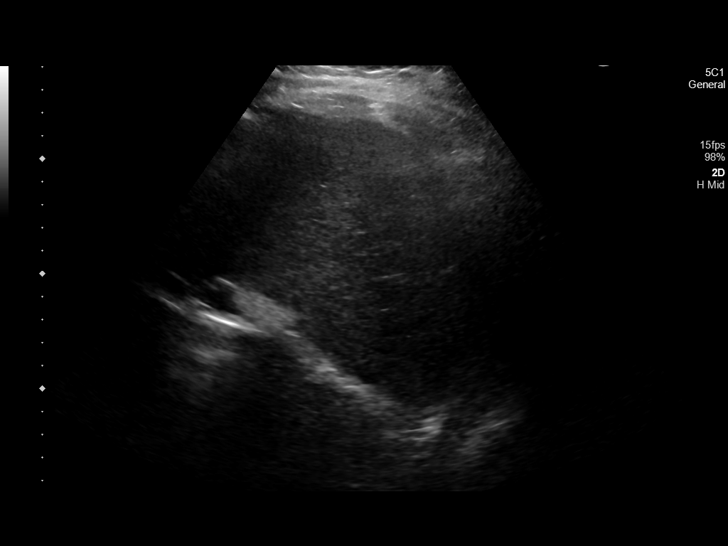
[im 28/38]
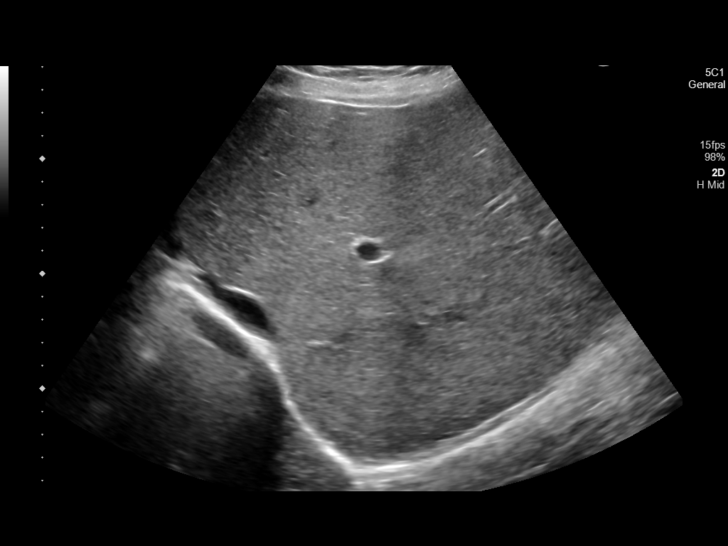
[im 31/38]
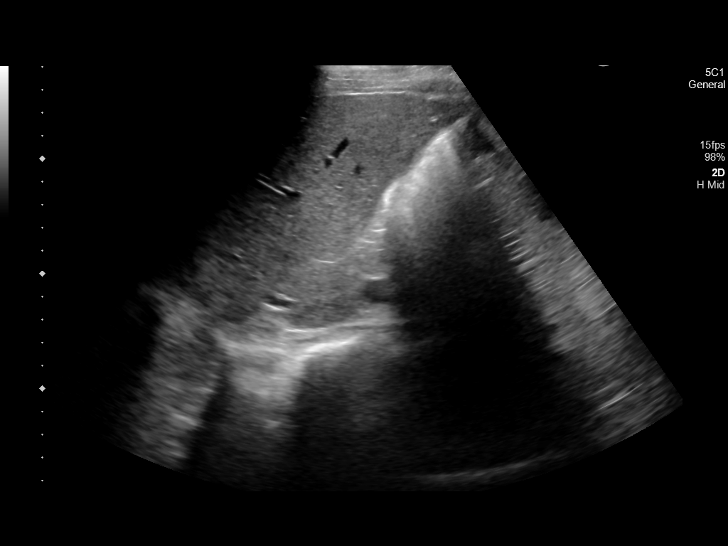
[im 34/38]
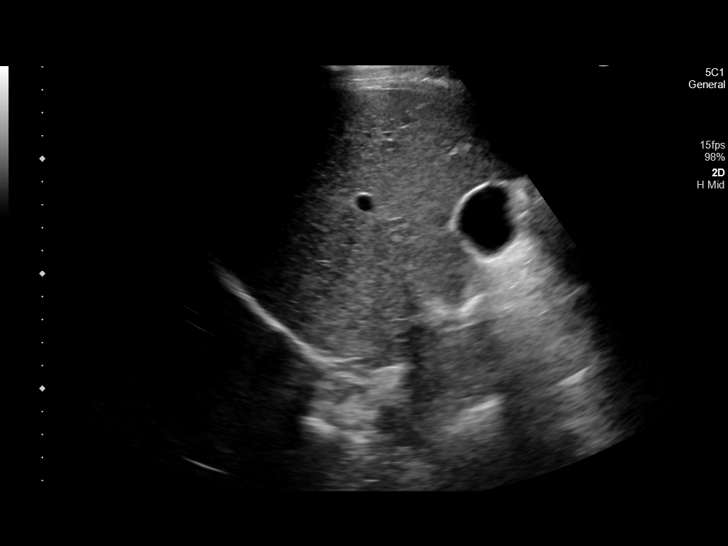
[im 38/38]
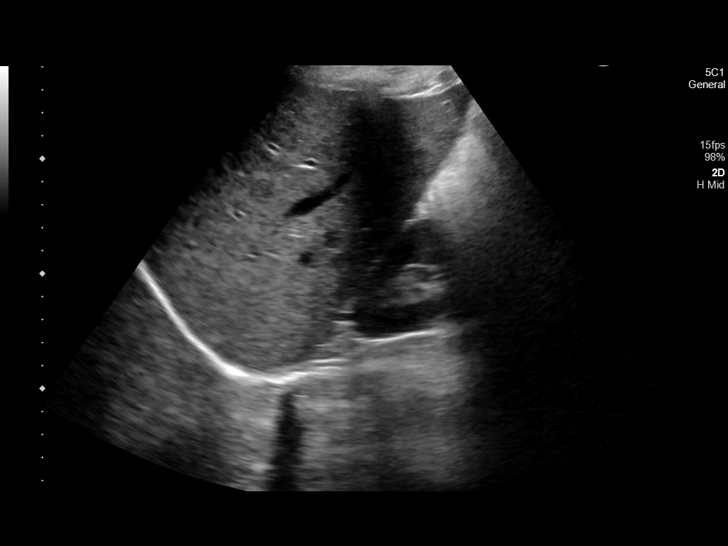

[14 of 25 positions shown; findings below may reference images not displayed]

FINDINGS: Gallbladder:

No gallstones or wall thickening visualized. No sonographic Murphy
sign noted by sonographer.

Common bile duct:

Diameter: 4 mm.

Liver:

No focal lesion identified. Within normal limits in parenchymal
echogenicity. Portal vein is patent on color Doppler imaging with
normal direction of blood flow towards the liver.

Other: None.
IMPRESSION: Normal right upper quadrant ultrasound.

## 2023-01-17 ENCOUNTER — Telehealth: Payer: Self-pay

## 2023-01-17 NOTE — Telephone Encounter (Signed)
Called patient. She had left a message Access Nurse line over the weekend. Called to check on patient, no answer. Advised her to come in for Nurse visit once AZO was out of her system.

## 2023-01-19 ENCOUNTER — Ambulatory Visit (INDEPENDENT_AMBULATORY_CARE_PROVIDER_SITE_OTHER): Payer: Medicaid Other

## 2023-01-19 VITALS — BP 110/70 | Ht 66.0 in | Wt 144.0 lb

## 2023-01-19 DIAGNOSIS — R399 Unspecified symptoms and signs involving the genitourinary system: Secondary | ICD-10-CM | POA: Diagnosis not present

## 2023-01-19 LAB — POCT URINALYSIS DIPSTICK
Bilirubin, UA: NEGATIVE
Glucose, UA: NEGATIVE
Nitrite, UA: POSITIVE
Protein, UA: POSITIVE — AB
Spec Grav, UA: <=1.005 — AB (ref 1.010–1.025)
Urobilinogen, UA: 0.2 E.U./dL
pH, UA: 5 (ref 5.0–8.0)

## 2023-01-19 MED ORDER — SULFAMETHOXAZOLE-TRIMETHOPRIM 800-160 MG PO TABS
1.0000 | ORAL_TABLET | Freq: Two times a day (BID) | ORAL | 1 refills | Status: DC
Start: 2023-01-19 — End: 2023-03-04

## 2023-01-19 NOTE — Telephone Encounter (Signed)
Patient transferred from front desk. Inquired about getting rx for abx for UTI. Advised she has to be seen/provide a specimen for testing/culture. Appointment scheduled for today @3 :50 pm

## 2023-01-19 NOTE — Patient Instructions (Signed)
Urinary Tract Infection, Adult A urinary tract infection (UTI) is an infection of any part of the urinary tract. The urinary tract includes: The kidneys. The ureters. The bladder. The urethra. These organs make, store, and get rid of pee (urine) in the body. What are the causes? This infection is caused by germs (bacteria) in your genital area. These germs grow and cause swelling (inflammation) of your urinary tract. What increases the risk? The following factors may make you more likely to develop this condition: Using a small, thin tube (catheter) to drain pee. Not being able to control when you pee or poop (incontinence). Being female. If you are female, these things can increase the risk: Using these methods to prevent pregnancy: A medicine that kills sperm (spermicide). A device that blocks sperm (diaphragm). Having low levels of a female hormone (estrogen). Being pregnant. You are more likely to develop this condition if: You have genes that add to your risk. You are sexually active. You take antibiotic medicines. You have trouble peeing because of: A prostate that is bigger than normal, if you are female. A blockage in the part of your body that drains pee from the bladder. A kidney stone. A nerve condition that affects your bladder. Not getting enough to drink. Not peeing often enough. You have other conditions, such as: Diabetes. A weak disease-fighting system (immune system). Sickle cell disease. Gout. Injury of the spine. What are the signs or symptoms? Symptoms of this condition include: Needing to pee right away. Peeing small amounts often. Pain or burning when peeing. Blood in the pee. Pee that smells bad or not like normal. Trouble peeing. Pee that is cloudy. Fluid coming from the vagina, if you are female. Pain in the belly or lower back. Other symptoms include: Vomiting. Not feeling hungry. Feeling mixed up (confused). This may be the first symptom in  older adults. Being tired and grouchy (irritable). A fever. Watery poop (diarrhea). How is this treated? Taking antibiotic medicine. Taking other medicines. Drinking enough water. In some cases, you may need to see a specialist. Follow these instructions at home:  Medicines Take over-the-counter and prescription medicines only as told by your doctor. If you were prescribed an antibiotic medicine, take it as told by your doctor. Do not stop taking it even if you start to feel better. General instructions Make sure you: Pee until your bladder is empty. Do not hold pee for a long time. Empty your bladder after sex. Wipe from front to back after peeing or pooping if you are a female. Use each tissue one time when you wipe. Drink enough fluid to keep your pee pale yellow. Keep all follow-up visits. Contact a doctor if: You do not get better after 1-2 days. Your symptoms go away and then come back. Get help right away if: You have very bad back pain. You have very bad pain in your lower belly. You have a fever. You have chills. You feeling like you will vomit or you vomit. Summary A urinary tract infection (UTI) is an infection of any part of the urinary tract. This condition is caused by germs in your genital area. There are many risk factors for a UTI. Treatment includes antibiotic medicines. Drink enough fluid to keep your pee pale yellow. This information is not intended to replace advice given to you by your health care provider. Make sure you discuss any questions you have with your health care provider. Document Revised: 03/02/2020 Document Reviewed: 03/07/2020 Elsevier Patient Education    2024 [employer] Inc[employer]  

## 2023-01-19 NOTE — Progress Notes (Signed)
    NURSE VISIT NOTE  Subjective:    Patient ID: Teresa [name], female    DOB: [birth year], 22 y.o.   MRN: [id number]       HPI  Patient is a 22 y.o. G43P1001 female who presents for dysuria and urinary frequency for 3 days.  Patient does have a history of recurrent UTI.  Patient does not have a history of pyelonephritis.    Objective:    BP 110/70   Ht 5\' 6"  (1.676 m)   Wt 144 lb (65.3 kg)   LMP 01/16/2023 (Exact Date)   BMI 23.24 kg/m    Lab Review  Results for orders placed or performed in visit on 01/19/23  POCT Urinalysis Dipstick  Result Value Ref Range   Color, UA     Clarity, UA     Glucose, UA Negative Negative   Bilirubin, UA neg    Ketones, UA small    Spec Grav, UA <=1.005 (A) 1.010 - 1.025   Blood, UA 3+    pH, UA 5.0 5.0 - 8.0   Protein, UA Positive (A) Negative   Urobilinogen, UA 0.2 0.2 or 1.0 E.U./dL   Nitrite, UA positive    Leukocytes, UA Moderate (2+) (A) Negative   Appearance     Odor      Assessment:   1. UTI symptoms      Plan:   Urine Culture Sent. Treatment  Bactrim DS 1 PO BID for 7 days. Maintain adequate hydration.  May use AZO OTC prn.  Follow up if symptoms worsen or fail to improve as anticipated, and as needed.    Donnetta Hail, CMA

## 2023-01-23 LAB — URINE CULTURE

## 2023-02-17 IMAGING — CT CT RENAL STONE PROTOCOL
2 of 4 series · 11 of 46 positions shown, 12 images · non-contrast
Comparison: Obstetrical ultrasound 08/19/2020, abdominal ultrasound
11/27/2020
COMPARISON: Obstetrical ultrasound 08/19/2020, abdominal ultrasound
11/27/2020

Addendum:
CLINICAL DATA: Low back pain, fever, 2 days postpartum

EXAM:
CT ABDOMEN AND PELVIS WITHOUT CONTRAST
TECHNIQUE: Multidetector CT imaging of the abdomen and pelvis was performed
following the standard protocol without IV contrast.

[Series 2: stone full standard · axial · 0.78mm/px · z∈[-523,-93]mm · 8 of 106 slices shown, 9 images]
[im 10/106  soft-tissue]
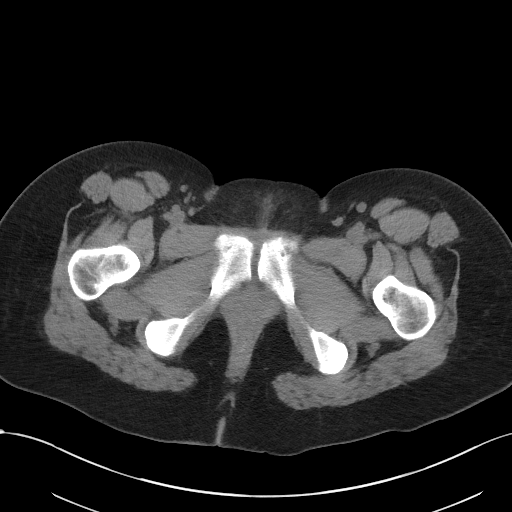
[im 10/106  bone]
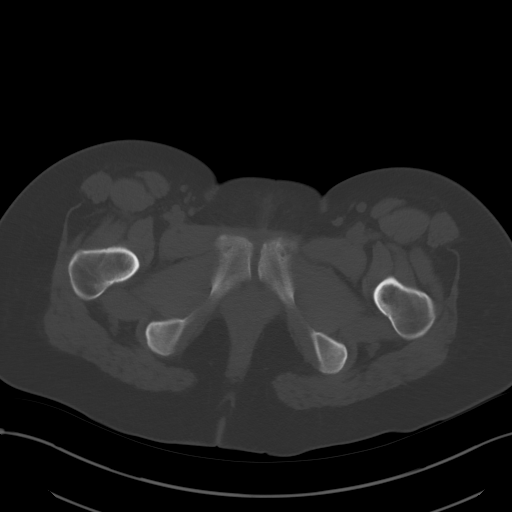
[im 20/106  soft-tissue]
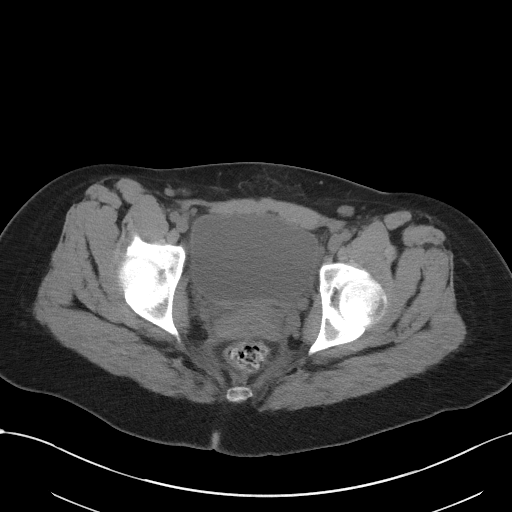
[im 34/106  soft-tissue]
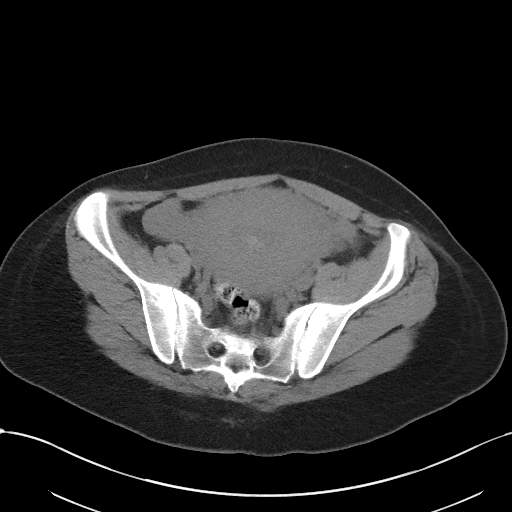
[im 48/106  soft-tissue]
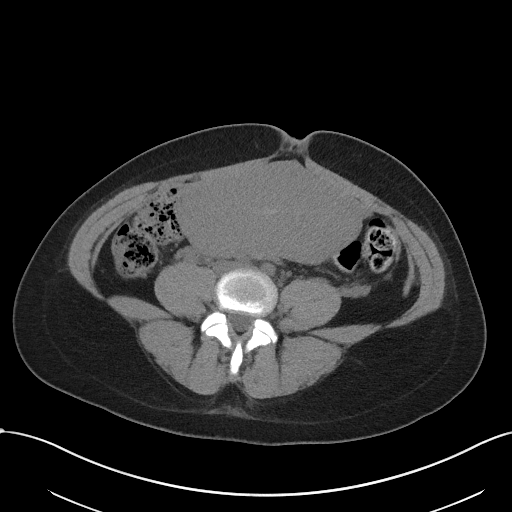
[im 58/106  soft-tissue]
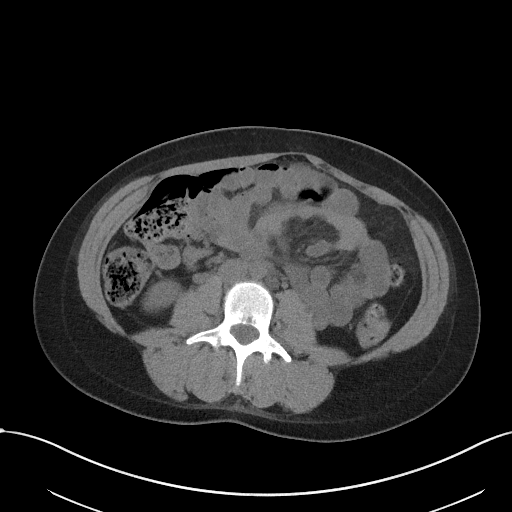
[im 72/106  soft-tissue]
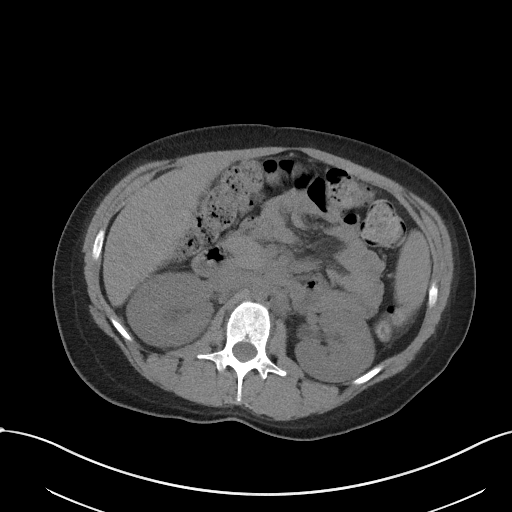
[im 86/106  soft-tissue]
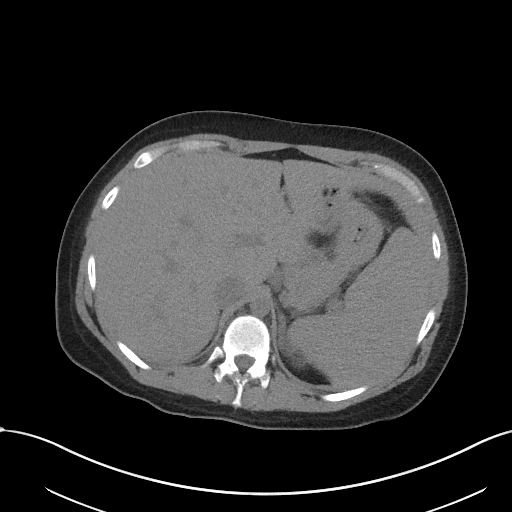
[im 96/106  soft-tissue]
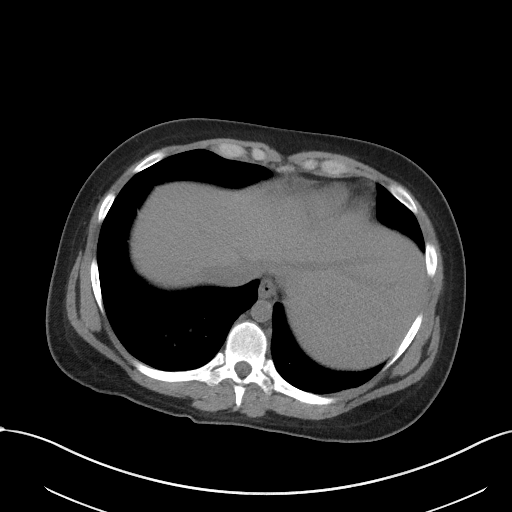

[Series 5: coronal · coronal · 0.70mm/px · 3 of 137 slices shown]
[im 46/137  soft-tissue]
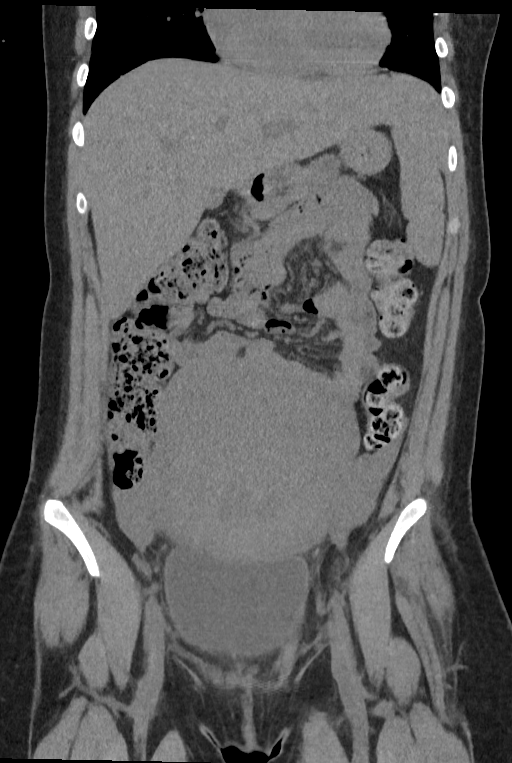
[im 61/137  soft-tissue]
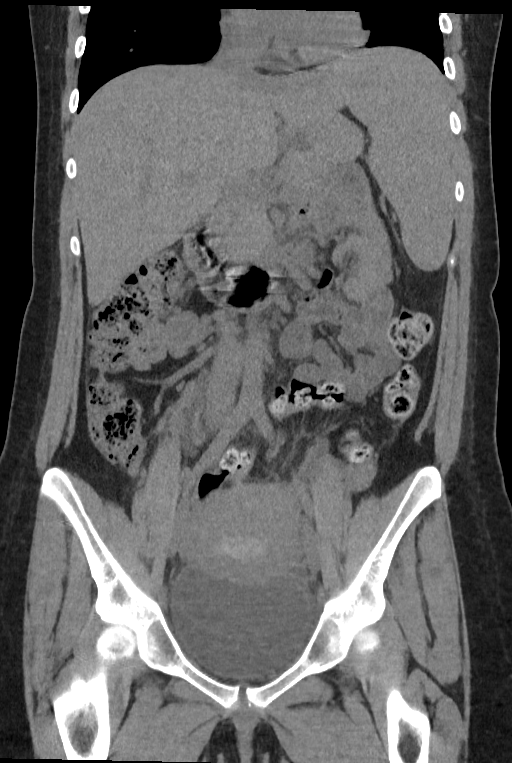
[im 76/137  soft-tissue]
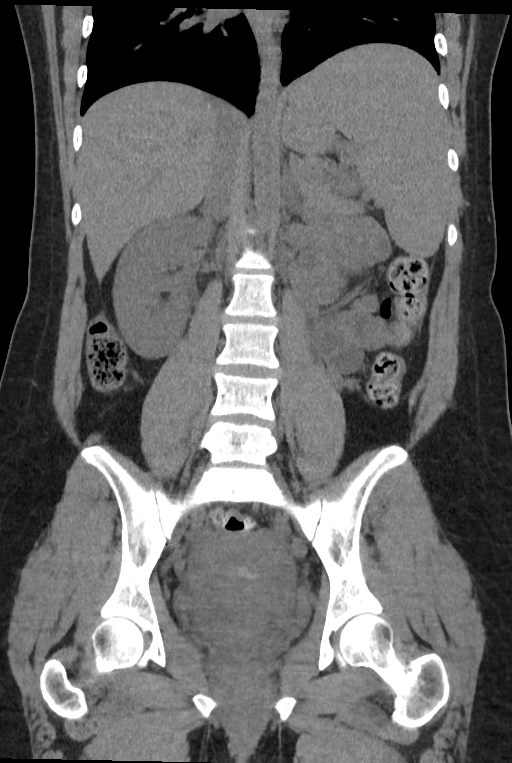

[11 of 46 positions shown; findings below may reference images not displayed]

FINDINGS: Lower chest: Lung bases are clear. Normal heart size. No pericardial
effusion.

Hepatobiliary: No worrisome focal liver abnormality is seen. Normal
gallbladder. No visible calcified gallstones. No biliary ductal
dilatation. Gallbladder partially decompressed at time of exam. No
gross gallbladder abnormality. No visible calcified gallstones or
biliary ductal dilatation.

Pancreas: No pancreatic ductal dilatation or surrounding
inflammatory changes.

Spleen: Splenomegaly.

Adrenals/Urinary Tract: Normal adrenal glands. Kidneys are symmetric
in size and normally located. No visible or contour deforming renal
lesion. Mild right perinephric stranding. Mild bilateral
hydroureteronephrosis to the level of the bladder. Question some
mild urothelial thickening towards the right renal pelvis as well
(2/40). Urinary bladder is fairly unremarkable for the degree of
distention. No visible bladder calculi or debris.

Stomach/Bowel: Distal esophagus, stomach and duodenum are
unremarkable. No small bowel thickening or dilatation. Normal
air-filled appendix in the right lower quadrant. Moderate colonic
stool burden.

Vascular/Lymphatic: No pathologically enlarged nodes are seen in the
abdomen or pelvis within the limitation of this unenhanced exam.
Lack of contrast media also limits evaluation of the vasculature
without gross acute vascular abnormality, aneurysm or ectasia.

Reproductive: There is an enlarged, retroflexed uterus, some of
which is expected and may persist up to 8 weeks postpartum. However,
there is more hyperdense material seen within the endometrial canal
which is concerning for acute to subacute hemorrhagic products on
this unenhanced CT examination. Maximal thickness of 15 mm. No
concerning adnexal lesions.

Other: No abdominopelvic free fluid or free gas. No bowel containing
hernias. Small fat containing umbilical.

Musculoskeletal: No acute osseous abnormality or suspicious osseous
lesion. Bone island seen anterior superior corner L5.
IMPRESSION: Enlarged uterus with myometrial thickening, nonspecific given the
recent postpartum state, such finding can persist to 8 weeks
following delivery.

More conspicuous hyperdensity seen within the endometrial canal on
this noncontrast CT examination could reflect acute or subacute
hemorrhagic products or possible retained products of conception.
Recommend further characterization pelvic ultrasound to include
color Doppler assessment.

Mild bilateral hydroureteronephrosis with some moderate bladder
distention. Some questionable urothelial thickening of the right
renal pelvis and faint right perinephric stranding is noted. Can
reflect incompletely resolved ventral hydronephrosis pregnancy.
However, recommend correlation with urinalysis to exclude an
ascending tract infection.

Currently attempting to contact the ordering provider with a
critical value result. Addendum will be submitted upon case
discussion.

ADDENDUM:
Additional impression point:

Nonobstructing calculus in the interpolar right kidney.

Initial results and addendum were called by telephone at the time of
physician contact on 12/31/2020 at [DATE] to provider EIMAN KAPLAN ,
who verbally acknowledged these results.

*** End of Addendum ***
FINDINGS: Lower chest: Lung bases are clear. Normal heart size. No pericardial
effusion.

Hepatobiliary: No worrisome focal liver abnormality is seen. Normal
gallbladder. No visible calcified gallstones. No biliary ductal
dilatation. Gallbladder partially decompressed at time of exam. No
gross gallbladder abnormality. No visible calcified gallstones or
biliary ductal dilatation.

Pancreas: No pancreatic ductal dilatation or surrounding
inflammatory changes.

Spleen: Splenomegaly.

Adrenals/Urinary Tract: Normal adrenal glands. Kidneys are symmetric
in size and normally located. No visible or contour deforming renal
lesion. Mild right perinephric stranding. Mild bilateral
hydroureteronephrosis to the level of the bladder. Question some
mild urothelial thickening towards the right renal pelvis as well
(2/40). Urinary bladder is fairly unremarkable for the degree of
distention. No visible bladder calculi or debris.

Stomach/Bowel: Distal esophagus, stomach and duodenum are
unremarkable. No small bowel thickening or dilatation. Normal
air-filled appendix in the right lower quadrant. Moderate colonic
stool burden.

Vascular/Lymphatic: No pathologically enlarged nodes are seen in the
abdomen or pelvis within the limitation of this unenhanced exam.
Lack of contrast media also limits evaluation of the vasculature
without gross acute vascular abnormality, aneurysm or ectasia.

Reproductive: There is an enlarged, retroflexed uterus, some of
which is expected and may persist up to 8 weeks postpartum. However,
there is more hyperdense material seen within the endometrial canal
which is concerning for acute to subacute hemorrhagic products on
this unenhanced CT examination. Maximal thickness of 15 mm. No
concerning adnexal lesions.

Other: No abdominopelvic free fluid or free gas. No bowel containing
hernias. Small fat containing umbilical.

Musculoskeletal: No acute osseous abnormality or suspicious osseous
lesion. Bone island seen anterior superior corner L5.
IMPRESSION: Enlarged uterus with myometrial thickening, nonspecific given the
recent postpartum state, such finding can persist to 8 weeks
following delivery.

More conspicuous hyperdensity seen within the endometrial canal on
this noncontrast CT examination could reflect acute or subacute
hemorrhagic products or possible retained products of conception.
Recommend further characterization pelvic ultrasound to include
color Doppler assessment.

Mild bilateral hydroureteronephrosis with some moderate bladder
distention. Some questionable urothelial thickening of the right
renal pelvis and faint right perinephric stranding is noted. Can
reflect incompletely resolved ventral hydronephrosis pregnancy.
However, recommend correlation with urinalysis to exclude an
ascending tract infection.

Currently attempting to contact the ordering provider with a
critical value result. Addendum will be submitted upon case
discussion.

## 2023-03-04 ENCOUNTER — Telehealth: Payer: Self-pay

## 2023-03-04 ENCOUNTER — Ambulatory Visit (INDEPENDENT_AMBULATORY_CARE_PROVIDER_SITE_OTHER): Payer: Medicaid Other

## 2023-03-04 VITALS — BP 98/64 | HR 105 | Resp 16 | Ht 66.0 in | Wt 143.4 lb

## 2023-03-04 DIAGNOSIS — Z3401 Encounter for supervision of normal first pregnancy, first trimester: Secondary | ICD-10-CM

## 2023-03-04 DIAGNOSIS — Z3201 Encounter for pregnancy test, result positive: Secondary | ICD-10-CM | POA: Diagnosis not present

## 2023-03-04 DIAGNOSIS — N912 Amenorrhea, unspecified: Secondary | ICD-10-CM

## 2023-03-04 LAB — POCT URINE PREGNANCY: Preg Test, Ur: POSITIVE — AB

## 2023-03-04 NOTE — Telephone Encounter (Signed)
Call was received from [facility] Scheduling and left on nurse triage line, caller stated that patient had contacted scheduling requesting a ultrasound. When I reviewed chart I see that patient was seen for nurse visit today to confirm pregnancy. Patient states that she told CMA she did not know LMP and requested ultrasound. Patient states that she was not scheduled for New OB intake visit, labs or ultrasound. If we would reach back to patient and please schedule. I will place order in chart now for ultrasound to be scheduled. Thank you. KW

## 2023-03-04 NOTE — Progress Notes (Signed)
    NURSE VISIT NOTE  Subjective:    Patient ID: Teresa [name], female    DOB: August 19, 2000, 22 y.o.   MRN: [id number]  HPI  Patient is a 22 y.o. G76P1001 female who presents for evaluation of amenorrhea. She believes she could be pregnant. Pregnancy is not desired. Sexual Activity: single partner, contraception: OCP (estrogen/progesterone). Current symptoms also include: positive home pregnancy test. Last period was she thinks it was 01/26/2023, but she is unsure. Patient was very upset and made it clear that she did not want to continue with the pregnancy. She is taking birth control pills, but she said she had been taking them incorrectly.    Objective:    BP 98/64   Pulse (!) 105   Resp 16   Ht 5\' 6"  (1.676 m)   Wt 143 lb 6.4 oz (65 kg)   LMP 01/26/2023 (Within Days) Comment: She is not sure about what date, but knows that it was end of June  BMI 23.15 kg/m   Lab Review   Lab Orders         POCT urine pregnancy   : Positive  Assessment:   1. Amenorrhea     Plan:   Pregnancy Test: Positive  Estimated Date of Delivery: 11/02/2023 Encouraged well-balanced diet, plenty of rest when needed, pre-natal vitamins daily and walking for exercise.  Discussed self-help for nausea, avoiding OTC medications until consulting provider or pharmacist, other than Tylenol as needed, minimal caffeine (1-2 cups daily) and avoiding alcohol.   Feel free to call with any questions. Patient was given a list of termination locations.     Santiago Bumpers, CMA [facility]

## 2023-03-04 NOTE — Telephone Encounter (Signed)
I contacted the patient via phone. We contacted centralized scheduling for ultrasound appointment. The patient confirm with scheduler for Monday, 7/29 at 1:45 at [facility]. The patient has been asked to contact our office with confirm dates so we are able to schedule for her NOB intake visit at 8 weeks. The patient confirmed she will call back with results for scheduling.

## 2023-03-07 ENCOUNTER — Ambulatory Visit
Admission: RE | Admit: 2023-03-07 | Discharge: 2023-03-07 | Disposition: A | Discharge status: Home / Self Care | Payer: Medicaid Other | Source: Ambulatory Visit | Attending: Obstetrics | Admitting: Obstetrics

## 2023-03-07 DIAGNOSIS — Z3401 Encounter for supervision of normal first pregnancy, first trimester: Secondary | ICD-10-CM | POA: Insufficient documentation

## 2023-03-07 DIAGNOSIS — Z3A01 Less than 8 weeks gestation of pregnancy: Secondary | ICD-10-CM | POA: Insufficient documentation

## 2023-03-07 DIAGNOSIS — Z3687 Encounter for antenatal screening for uncertain dates: Secondary | ICD-10-CM | POA: Insufficient documentation

## 2023-03-28 ENCOUNTER — Encounter: Payer: Self-pay | Admitting: Dermatology

## 2023-03-28 ENCOUNTER — Ambulatory Visit: Payer: Medicaid Other | Admitting: Dermatology

## 2023-03-28 VITALS — BP 100/65

## 2023-03-28 DIAGNOSIS — L910 Hypertrophic scar: Secondary | ICD-10-CM

## 2023-03-28 DIAGNOSIS — D225 Melanocytic nevi of trunk: Secondary | ICD-10-CM | POA: Diagnosis not present

## 2023-03-28 NOTE — Progress Notes (Signed)
   Follow-Up Visit   Subjective  Teresa [name] is a 22 y.o. female who presents for the following: mole, back, irritated by clothing, wash cloth, has been removed in past 05/21/2021   The patient has spots, moles and lesions to be evaluated, some may be new or changing and the patient may have concern these could be cancer.   The following portions of the chart were reviewed this encounter and updated as appropriate: medications, allergies, medical history  Review of Systems:  No other skin or systemic complaints except as noted in HPI or Assessment and Plan.  Objective  Well appearing patient in no apparent distress; mood and affect are within normal limits.   A focused examination was performed of the following areas: back  Relevant exam findings are noted in the Assessment and Plan.         Assessment & Plan   HYPERTROPHIC SCAR with recurrent nevus, Bx proven benign nevus within Previous bx 05/21/21 Upper back spinal superior Exam: indurated scarred thin plaque with internal pigment, benign network, on upper back spinal superior  Benign-appearing.  Observation.  Call clinic for new or changing lesions. Recommend daily broad spectrum sunscreen SPF 30+, reapply every 2 hours as needed.  Treatment: given symptoms, patient would like treatment of scar/nevus. discussed treatment options, IL steroid injection vs punch bx vs excising scar/recurrent nevus  Patient prefers to excise scar/recurrent nevus, will schedule for surgery  SCAR WITH RECURRENT NEVUS Secondary to bx 05/21/2021 Upper back spinal inferior Exam: Scarred macule with recurrent brown macule upper back spinal inferior  Treatment Plan: Benign, observe    Return for schedule for surgery to excise Hypertrophic scar with recurrent nevus.  I, Ardis Rowan, RMA, am acting as scribe for Elie Goody, MD .   Documentation: I have reviewed the above documentation for accuracy and completeness, and I agree  with the above.  Elie Goody, MD

## 2023-03-28 NOTE — Patient Instructions (Addendum)

## 2023-04-13 ENCOUNTER — Encounter: Payer: Medicaid Other | Admitting: Dermatology

## 2023-04-27 ENCOUNTER — Telehealth: Payer: Self-pay

## 2023-04-27 ENCOUNTER — Encounter: Payer: Self-pay | Admitting: Dermatology

## 2023-04-27 ENCOUNTER — Ambulatory Visit: Payer: Medicaid Other | Admitting: Dermatology

## 2023-04-27 DIAGNOSIS — D225 Melanocytic nevi of trunk: Secondary | ICD-10-CM

## 2023-04-27 DIAGNOSIS — L910 Hypertrophic scar: Secondary | ICD-10-CM

## 2023-04-27 DIAGNOSIS — L905 Scar conditions and fibrosis of skin: Secondary | ICD-10-CM

## 2023-04-27 MED ORDER — TRIAMCINOLONE ACETONIDE 40 MG/ML IJ SUSP
20.0000 mg | Freq: Once | INTRAMUSCULAR | Status: AC
Start: 2023-04-27 — End: 2023-04-27
  Administered 2023-04-27: 20 mg via INTRADERMAL

## 2023-04-27 NOTE — Patient Instructions (Addendum)
Wound Care Instructions  Cleanse wound gently with soap and water once a day then pat dry with clean gauze. Apply a thin coat of Petrolatum (petroleum jelly, "Vaseline") over the wound (unless you have an allergy to this). We recommend that you use a new, sterile tube of Vaseline. Do not pick or remove scabs. Do not remove the yellow or white "healing tissue" from the base of the wound.  Cover the wound with fresh, clean, nonstick gauze and secure with paper tape. You may use Band-Aids in place of gauze and tape if the wound is small enough, but would recommend trimming much of the tape off as there is often too much. Sometimes Band-Aids can irritate the skin.  You should call the office for your biopsy report after 1 week if you have not already been contacted.  If you experience any problems, such as abnormal amounts of bleeding, swelling, significant bruising, significant pain, or evidence of infection, please call the office immediately.  FOR ADULT SURGERY PATIENTS: If you need something for pain relief you may take 1 extra strength Tylenol (acetaminophen) AND 2 Ibuprofen (200mg  each) together every 4 hours as needed for pain. (do not take these if you are allergic to them or if you have a reason you should not take them.) Typically, you may only need pain medication for 1 to 3 days.   Intralesional steroid injection side effects were reviewed including thinning of the skin and discoloration, such as redness, lightening or darkening.  Due to recent changes in healthcare laws, you may see results of your pathology and/or laboratory studies on MyChart before the doctors have had a chance to review them. We understand that in some cases there may be results that are confusing or concerning to you. Please understand that not all results are received at the same time and often the doctors may need to interpret multiple results in order to provide you with the best plan of care or course of treatment.  Therefore, we ask that you please give [place] 2 business days to thoroughly review all your results before contacting the office for clarification. Should we see a critical lab result, you will be contacted sooner.   If You Need Anything After Your Visit  If you have any questions or concerns for your doctor, please call our main line at [phone] and press option 4 to reach your doctor's medical assistant. If no one answers, please leave a voicemail as directed and we will return your call as soon as possible. Messages left after 4 pm will be answered the following business day.   You may also send [place] a message via MyChart. We typically respond to MyChart messages within 1-2 business days.  For prescription refills, please ask your pharmacy to contact our office. Our fax number is [phone].  If you have an urgent issue when the clinic is closed that cannot wait until the next business day, you can page your doctor at the number below.    Please note that while we do our best to be available for urgent issues outside of office hours, we are not available 24/7.   If you have an urgent issue and are unable to reach [place], you may choose to seek medical care at your doctor's office, retail clinic, urgent care center, or emergency room.  If you have a medical emergency, please immediately call 911 or go to the emergency department.  Pager Numbers  - Dr. Gwen Pounds: [phone]  - Dr. Roseanne Reno:  [phone]  - Dr. Katrinka Blazing: [phone]   In the event of inclement weather, please call our main line at [phone] for an update on the status of any delays or closures.  Dermatology Medication Tips: Please keep the boxes that topical medications come in in order to help keep track of the instructions about where and how to use these. Pharmacies typically print the medication instructions only on the boxes and not directly on the medication tubes.   If your medication is too expensive, please  contact our office at [phone] option 4 or send [place] a message through MyChart.   We are unable to tell what your co-pay for medications will be in advance as this is different depending on your insurance coverage. However, we may be able to find a substitute medication at lower cost or fill out paperwork to get insurance to cover a needed medication.   If a prior authorization is required to get your medication covered by your insurance company, please allow [place] 1-2 business days to complete this process.  Drug prices often vary depending on where the prescription is filled and some pharmacies may offer cheaper prices.  The website www.goodrx.com contains coupons for medications through different pharmacies. The prices here do not account for what the cost may be with help from insurance (it may be cheaper with your insurance), but the website can give you the price if you did not use any insurance.  - You can print the associated coupon and take it with your prescription to the pharmacy.  - You may also stop by our office during regular business hours and pick up a GoodRx coupon card.  - If you need your prescription sent electronically to a different pharmacy, notify our office through [facility] or by phone at [phone] option 4.     Si Usted Necesita Algo Despus de Su Visita  Tambin puede enviarnos un mensaje a travs de Clinical cytogeneticist. Por lo general respondemos a los mensajes de MyChart en el transcurso de 1 a 2 das hbiles.  Para renovar recetas, por favor pida a su farmacia que se ponga en contacto con nuestra oficina. Annie Sable de fax es [place] [phone].  Si tiene un asunto urgente cuando la clnica est cerrada y que no puede esperar hasta el siguiente da hbil, puede llamar/localizar a su doctor(a) al nmero que aparece a continuacin.   Por favor, tenga en cuenta que aunque hacemos todo lo posible para estar disponibles para asuntos urgentes fuera del horario de  [place], no estamos disponibles las 24 horas del da, los 7 [address]  [address] de la [place].   Si tiene un problema urgente y no puede comunicarse con nosotros, puede optar por buscar atencin mdica  en el consultorio de su doctor(a), en una clnica privada, en un centro de atencin urgente o en una sala de emergencias.  Si tiene Engineer, drilling, por favor llame inmediatamente al 911 o vaya a la sala de emergencias.  Nmeros de bper  - Dr. Gwen Pounds: [phone]  - Dra. Roseanne Reno: [phone]  - Dr. Katrinka Blazing: [phone]   En caso de inclemencias del tiempo, por favor llame a Lacy Duverney principal al [phone] para una actualizacin sobre el [place] de cualquier retraso o cierre.  Consejos para la medicacin en dermatologa: Por favor, guarde las cajas en las que vienen los medicamentos de uso tpico para ayudarle a seguir las instrucciones sobre dnde y cmo usarlos. Las farmacias generalmente imprimen las instrucciones del medicamento slo en las cajas y  no directamente en los tubos del medicamento.   Si su medicamento es muy caro, por favor, pngase en contacto con Rolm Gala llamando al [phone] y presione la opcin 4 o envenos un mensaje a travs de Clinical cytogeneticist.   No podemos decirle cul ser su copago por los medicamentos por adelantado ya que esto es diferente dependiendo de la cobertura de su seguro. Sin embargo, es posible que podamos encontrar un medicamento sustituto a Audiological scientist un formulario para que el seguro cubra el medicamento que se considera necesario.   Si se requiere una autorizacin previa para que su compaa de seguros [place] su medicamento, por favor permtanos de 1 a 2 das hbiles para completar [address]  Los precios de los medicamentos varan con frecuencia dependiendo del Environmental consultant de dnde se surte la receta y alguna farmacias pueden ofrecer precios ms baratos.  El sitio web www.goodrx.com tiene cupones para medicamentos de Health and safety inspector.  Los precios aqu no tienen en cuenta lo que podra costar con la ayuda del seguro (puede ser ms barato con su seguro), pero el sitio web puede darle el precio si no utiliz Tourist information centre manager.  - Puede imprimir el cupn correspondiente y llevarlo con su receta a la farmacia.  - Tambin puede pasar por nuestra oficina durante el horario de atencin regular y Education officer, museum una tarjeta de cupones de GoodRx.  - Si necesita que su receta se enve electrnicamente a una farmacia diferente, informe a nuestra oficina a travs de MyChart de [place] o por telfono llamando al [phone] y presione la opcin 4.

## 2023-04-27 NOTE — Telephone Encounter (Signed)
Patient called the office in a lot of pain from procedure this morning. She states that as she was lifting her toddler out of the car seat and she felt everything pulling and in a lot of pain. She feels as if this will limit her activities with her child.  Patient asked would you prescribe any pain medication short term to help symptoms.

## 2023-04-27 NOTE — Progress Notes (Signed)
Follow-Up Visit   Subjective  Teresa [name] is a 22 y.o. female who presents for the following: shave removal and ILK injections for scar with recurrent nevus.   Medical record stated that patient is pregnant with due date March 2025. Patient stated this was incorrect and was confident about not being pregnant. Discussed that procedure should NOT be done if there is any chance that patient is pregnant.  The patient has spots, moles and lesions to be evaluated, some may be new or changing and the patient may have concern these could be cancer.   The following portions of the chart were reviewed this encounter and updated as appropriate: medications, allergies, medical history  Review of Systems:  No other skin or systemic complaints except as noted in HPI or Assessment and Plan.  Objective  Well appearing patient in no apparent distress; mood and affect are within normal limits.   A focused examination was performed of the following areas: back, axilla  Relevant exam findings are noted in the Assessment and Plan.  upper back spinal superior Indurated scarred thin plaque with internal pigment, benign network        Assessment & Plan     Hypertrophic scar upper back spinal superior  Discussed treatment options extensively. Lesion is a benign hypertrophic scar with recurrent nevus within it. Patient reports pruritus and discomfort due to lesion, especially when bra rubs in the area. The raised nature of the scar is most bothersome over the appearance. Discussed that options include no treatment given lesion is benign vs  shave removal with intralesional triamcinolone to reduce the thickness of the new scar (risk of hypopigmentation atrophy) vs excision and linear repair with stitches (will make a linear scar that is three times longer than current scar and may still become hypertrophic/pruritic given location--subsequent intralesional triamcinolone could be injected at that  point to soften scar). Patient opts for shave removal and intralesional triamcinolone. Emphasized that close follow up is critical for repeat injections. If scar is allowed to develop, it will become hypertrophic again. Patient voiced understanding.  NDC [phone] Lot # [id number]  Exp: 11/2024  Epidermal / dermal shaving - upper back spinal superior  Lesion diameter (cm):  0.9 Informed consent: discussed and consent obtained   Patient was prepped and draped in usual sterile fashion: area prepped with alcohol. Anesthesia: the lesion was anesthetized in a standard fashion   Anesthetic:  1% lidocaine w/ epinephrine 1-100,000 buffered w/ 8.4% NaHCO3 Instrument used: DermaBlade   Hemostasis achieved with: pressure and aluminum chloride   Outcome: patient tolerated procedure well   Post-procedure details: wound care instructions given   Post-procedure details comment:  Ointment and small bandage applied  Intralesional injection - upper back spinal superior Location: upper back spinal superior  Informed Consent: Discussed risks (infection, pain, bleeding, bruising, thinning of the skin, loss of skin pigment, lack of resolution, and recurrence of lesion) and benefits of the procedure, as well as the alternatives. Informed consent was obtained. Preparation: The area was prepared a standard fashion.  Procedure Details: An intralesional injection was performed with Kenalog 20 mg/cc. 0.25 cc in total were injected.  Total number of injections: 1  Plan: The patient was instructed on post-op care. Recommend OTC analgesia as needed for pain.   Specimen 1 - Surgical pathology Differential Diagnosis: Hypertrophic scar with recurrent nevus  Check Margins: No Indurated scarred thin plaque with internal pigment, benign network  Related Medications triamcinolone acetonide (KENALOG-40) injection 20 mg  Other Procedures Placed This Encounter Surgical pathology   Return in about 2 weeks  (around 05/11/2023).  Anise Salvo, RMA, am acting as scribe for Elie Goody, MD .   Documentation: I have reviewed the above documentation for accuracy and completeness, and I agree with the above.  Elie Goody, MD

## 2023-04-27 NOTE — Telephone Encounter (Signed)
Called patient. She is feeling better taking Ibuprofen (her stomach can not tolerate Tylenol). She will call tomorrow if still in pain. aw

## 2023-04-29 LAB — SURGICAL PATHOLOGY

## 2023-05-03 ENCOUNTER — Encounter: Payer: Self-pay | Admitting: Dermatology

## 2023-05-03 ENCOUNTER — Telehealth: Payer: Self-pay

## 2023-05-03 NOTE — Telephone Encounter (Signed)
Called patient to advise pathology results. Patient advised that she has not changed bandage and that today it had moved and had pus. She also said that the area of shave removal is still throbbing. 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 Patient called last week on the day of the procedure and wanted something for pain. She was advised to alternate ibuprofen and tylenol. Patient said she can not take tylenol and that the ibuprofen is not helping. She wanted to know why she can't get something for her pain from her doctor.  I offered patient an appointment today to check area but she has a funeral to go to so I told her I would pass this message on to Dr. Katrinka Blazing. Butch Penny.,

## 2023-05-03 NOTE — Telephone Encounter (Signed)
-----   Message from [place] sent at 05/02/2023 10:36 PM EDT ----- Diagnosis Skin, upper back spinal superior :       FINDINGS COMPATIBLE WITH RECURRENT NEVUS, PERIPHERAL AND DEEP MARGINS INVOLVED    Please call to share that biopsy shows a mole within scar as expected. please get update on wound and pain. Thank you.

## 2023-05-12 ENCOUNTER — Ambulatory Visit: Payer: Medicaid Other | Admitting: Dermatology

## 2023-06-06 ENCOUNTER — Encounter: Payer: Self-pay | Admitting: Dermatology

## 2023-06-27 ENCOUNTER — Ambulatory Visit: Payer: Medicaid Other | Admitting: Dermatology

## 2023-07-13 ENCOUNTER — Ambulatory Visit: Payer: Medicaid Other | Admitting: Dermatology

## 2023-09-20 ENCOUNTER — Other Ambulatory Visit: Payer: Self-pay

## 2023-09-20 ENCOUNTER — Emergency Department
Admission: EM | Admit: 2023-09-20 | Discharge: 2023-09-20 | Disposition: A | Discharge status: Home / Self Care | Payer: Medicaid Other | Attending: Emergency Medicine | Admitting: Emergency Medicine

## 2023-09-20 ENCOUNTER — Emergency Department: Payer: Medicaid Other

## 2023-09-20 DIAGNOSIS — B974 Respiratory syncytial virus as the cause of diseases classified elsewhere: Secondary | ICD-10-CM | POA: Insufficient documentation

## 2023-09-20 DIAGNOSIS — G43909 Migraine, unspecified, not intractable, without status migrainosus: Secondary | ICD-10-CM | POA: Diagnosis not present

## 2023-09-20 DIAGNOSIS — R519 Headache, unspecified: Secondary | ICD-10-CM | POA: Diagnosis present

## 2023-09-20 DIAGNOSIS — B338 Other specified viral diseases: Secondary | ICD-10-CM

## 2023-09-20 LAB — CBC WITH DIFFERENTIAL/PLATELET
Abs Immature Granulocytes: 0.03 10*3/uL (ref 0.00–0.07)
Basophils Absolute: 0 10*3/uL (ref 0.0–0.1)
Basophils Relative: 0 %
Eosinophils Absolute: 0.1 10*3/uL (ref 0.0–0.5)
Eosinophils Relative: 1 %
HCT: 35.5 % — ABNORMAL LOW (ref 36.0–46.0)
Hemoglobin: 12.9 g/dL (ref 12.0–15.0)
Immature Granulocytes: 0 %
Lymphocytes Relative: 31 %
Lymphs Abs: 3.2 10*3/uL (ref 0.7–4.0)
MCH: 32.2 pg (ref 26.0–34.0)
MCHC: 36.3 g/dL — ABNORMAL HIGH (ref 30.0–36.0)
MCV: 88.5 fL (ref 80.0–100.0)
Monocytes Absolute: 0.6 10*3/uL (ref 0.1–1.0)
Monocytes Relative: 5 %
Neutro Abs: 6.5 10*3/uL (ref 1.7–7.7)
Neutrophils Relative %: 63 %
Platelets: 233 10*3/uL (ref 150–400)
RBC: 4.01 MIL/uL (ref 3.87–5.11)
RDW: 12.4 % (ref 11.5–15.5)
WBC: 10.4 10*3/uL (ref 4.0–10.5)
nRBC: 0 % (ref 0.0–0.2)

## 2023-09-20 LAB — BASIC METABOLIC PANEL
Anion gap: 11 (ref 5–15)
BUN: 12 mg/dL (ref 6–20)
CO2: 23 mmol/L (ref 22–32)
Calcium: 9 mg/dL (ref 8.9–10.3)
Chloride: 106 mmol/L (ref 98–111)
Creatinine, Ser: 0.71 mg/dL (ref 0.44–1.00)
GFR, Estimated: >60 mL/min (ref >60)
Glucose, Bld: 86 mg/dL (ref 70–99)
Potassium: 3.3 mmol/L — ABNORMAL LOW (ref 3.5–5.1)
Sodium: 140 mmol/L (ref 135–145)

## 2023-09-20 LAB — RESP PANEL BY RT-PCR (RSV, FLU A&B, COVID)  RVPGX2
Influenza A by PCR: NEGATIVE
Influenza B by PCR: NEGATIVE
Resp Syncytial Virus by PCR: POSITIVE — AB
SARS Coronavirus 2 by RT PCR: NEGATIVE

## 2023-09-20 LAB — URINALYSIS, ROUTINE W REFLEX MICROSCOPIC
Bilirubin Urine: NEGATIVE
Glucose, UA: NEGATIVE mg/dL
Ketones, ur: 5 mg/dL — AB
Leukocytes,Ua: NEGATIVE
Nitrite: NEGATIVE
Protein, ur: NEGATIVE mg/dL
Specific Gravity, Urine: 1.025 (ref 1.005–1.030)
pH: 5 (ref 5.0–8.0)

## 2023-09-20 LAB — POC URINE PREG, ED: Preg Test, Ur: NEGATIVE

## 2023-09-20 MED ORDER — METOCLOPRAMIDE HCL 5 MG/ML IJ SOLN
10.0000 mg | Freq: Once | INTRAMUSCULAR | Status: AC
  Filled 2023-09-20: qty 2

## 2023-09-20 MED ORDER — METOCLOPRAMIDE HCL 10 MG PO TABS: 10.0000 mg | ORAL_TABLET | Freq: Three times a day (TID) | ORAL | 0 refills | Status: AC | PRN

## 2023-09-20 MED ORDER — DIPHENHYDRAMINE HCL 50 MG/ML IJ SOLN
25.0000 mg | Freq: Once | INTRAMUSCULAR | Status: AC
  Administered 2023-09-20: 25 mg via INTRAVENOUS
  Filled 2023-09-20: qty 1

## 2023-09-20 MED ORDER — SODIUM CHLORIDE 0.9 % IV BOLUS
1000.0000 mL | Freq: Once | INTRAVENOUS | Status: AC
  Administered 2023-09-20: 1000 mL via INTRAVENOUS

## 2023-09-20 MED ORDER — METOCLOPRAMIDE HCL 5 MG/ML IJ SOLN
INTRAMUSCULAR | Status: AC
  Administered 2023-09-20: 10 mg via INTRAVENOUS
  Filled 2023-09-20: qty 2

## 2023-09-20 NOTE — ED Provider Notes (Signed)
[facility] Provider Note    Event Date/Time   First MD Initiated Contact with Patient 09/20/23 2109     (approximate)   History   Headache   HPI  Teresa [name] is a 23 y.o. female with a history of ADHD and generalized anxiety who presents with headache for the last 3 days, right-sided, throbbing, associated with photophobia as well as with nausea but no vomiting.  She has not had a headache like this before and does not have a history of migraines.  She was treated for a sinus infection last week with an antibiotic.  She denies any facial pain or increased congestion the last few days.  She does report a nonproductive cough and some lightheadedness.  She has not had any fever or chills.  She denies any diarrhea.  She denies any head trauma.  She has no neck stiffness.  I reviewed the past medical records.  The patient is most recent outpatient encounter was on 1/31 with family medicine for acute frontal sinusitis which was treated with a Z-Pak and his own.   Physical Exam   Triage Vital Signs: ED Triage Vitals  Encounter Vitals Group     BP 09/20/23 1947 103/73     Systolic BP Percentile --      Diastolic BP Percentile --      Pulse Rate 09/20/23 1947 90     Resp 09/20/23 1947 18     Temp 09/20/23 1947 98.1 F (36.7 C)     Temp Source 09/20/23 1947 Oral     SpO2 09/20/23 1947 100 %     Weight 09/20/23 1947 130 lb (59 kg)     Height 09/20/23 1947 5\' 6"  (1.676 m)     Head Circumference --      Peak Flow --      Pain Score 09/20/23 1946 5     Pain Loc --      Pain Education --      Exclude from Growth Chart --     Most recent vital signs: Vitals:   09/20/23 1947  BP: 103/73  Pulse: 90  Resp: 18  Temp: 98.1 F (36.7 C)  SpO2: 100%     General: Alert and oriented, uncomfortable but in no distress.  CV:  Good peripheral perfusion.  Resp:  Normal effort.  Abd:  No distention.  Other:  EOMI.  PERRLA.  Moderate photophobia.  No facial  droop.  Normal speech.  Motor intact in all extremities.  No ataxia finger-to-nose.  No pronator drift.  Neck supple, full ROM.  No meningeal signs.   ED Results / Procedures / Treatments   Labs (all labs ordered are listed, but only abnormal results are displayed) Labs Reviewed  RESP PANEL BY RT-PCR (RSV, FLU A&B, COVID)  RVPGX2 - Abnormal; Notable for the following components:      Result Value   Resp Syncytial Virus by PCR POSITIVE (*)    All other components within normal limits  CBC WITH DIFFERENTIAL/PLATELET - Abnormal; Notable for the following components:   HCT 35.5 (*)    MCHC 36.3 (*)    All other components within normal limits  BASIC METABOLIC PANEL - Abnormal; Notable for the following components:   Potassium 3.3 (*)    All other components within normal limits  URINALYSIS, ROUTINE W REFLEX MICROSCOPIC - Abnormal; Notable for the following components:   Color, Urine YELLOW (*)    APPearance HAZY (*)  Hgb urine dipstick SMALL (*)    Ketones, ur 5 (*)    Bacteria, UA RARE (*)    All other components within normal limits  POC URINE PREG, ED     EKG     RADIOLOGY  CT head: I independently viewed and interpreted the images; there is no ICH.  Radiology report indicates sinus disease with no acute intracranial abnormality.  PROCEDURES:  Critical Care performed: No  Procedures   MEDICATIONS ORDERED IN ED: Medications  sodium chloride 0.9 % bolus 1,000 mL (1,000 mLs Intravenous New Bag/Given 09/20/23 2153)  metoCLOPramide (REGLAN) injection 10 mg (10 mg Intravenous Given 09/20/23 2153)  diphenhydrAMINE (BENADRYL) injection 25 mg (25 mg Intravenous Given 09/20/23 2153)     IMPRESSION / MDM / ASSESSMENT AND PLAN / ED COURSE  I reviewed the triage vital signs and the nursing notes.  23 year old female with PMH as noted above presents with a unilateral throbbing headache with nausea and photophobia for the last couple of days associated with cough and  lightheadedness.  On exam the patient is well-appearing.  Her vital signs are normal.  She has some photophobia.  Neurologic exam is nonfocal.  There are no meningeal signs.  Differential diagnosis includes, but is not limited to, migraine headache, tension or other benign headache, viral syndrome, dehydration.  There is no evidence of meningitis given that the patient has no fever or meningeal signs.  There is no evidence of SAH especially given the nonfocal neurologic exam.    CT head is negative.  Respiratory panel is positive for RSV.  BMP and CBC show no acute findings.  Overall presentation is consistent with migraine triggered by RSV.  We will give fluids, Reglan, Benadryl, and reassess.  Patient's presentation is most consistent with acute complicated illness / injury requiring diagnostic workup.  ----------------------------------------- 11:07 PM on 09/20/2023 -----------------------------------------  The patient is feeling significantly better after Reglan and Benadryl and states she is well to go home.  I counseled her on the results of the workup and plan of care.  I prescribed additional Reglan for home.  She will also take ibuprofen and/or Tylenol.  I answered all of her questions.  I gave strict return precautions and she expressed understanding.  FINAL CLINICAL IMPRESSION(S) / ED DIAGNOSES   Final diagnoses:  RSV infection  Migraine without status migrainosus, not intractable, unspecified migraine type     Rx / DC Orders   ED Discharge Orders          Ordered    metoCLOPramide (REGLAN) 10 MG tablet  Every 8 hours PRN        09/20/23 2258             Note:  This document was prepared using Dragon voice recognition software and may include unintentional dictation errors.    Dionne Bucy, MD 09/20/23 [phone]

## 2023-09-20 NOTE — ED Triage Notes (Signed)
Pt reports headache since yesterday, pt having light sensitivity appetite loss nausea. Pt reports no known hx migraines. Pt denies weakness or numbness in extremity, no slurred speech.

## 2023-09-20 NOTE — Discharge Instructions (Signed)
You may take ibuprofen or Tylenol as needed for the headache, as well as the Reglan that was prescribed today; this should help both with the headache and with nausea.  Follow-up with your primary care doctor.  Return to the ER for new, worsening, or persistent severe headache, vision changes, vomiting, fever, neck stiffness, weakness, or any other new or worsening symptoms that concern you.
